# Patient Record
Sex: Female | Born: 1960 | Race: White | Hispanic: No | Smoking: Former smoker
Health system: Southern US, Community
[De-identification: ages and names within clinical notes are randomized; demographics above are authoritative.]

## PROBLEM LIST (undated history)

## (undated) DIAGNOSIS — E039 Hypothyroidism, unspecified: Secondary | ICD-10-CM

## (undated) DIAGNOSIS — E785 Hyperlipidemia, unspecified: Secondary | ICD-10-CM

## (undated) DIAGNOSIS — G4733 Obstructive sleep apnea (adult) (pediatric): Secondary | ICD-10-CM

## (undated) DIAGNOSIS — C159 Malignant neoplasm of esophagus, unspecified: Secondary | ICD-10-CM

## (undated) DIAGNOSIS — M1990 Unspecified osteoarthritis, unspecified site: Secondary | ICD-10-CM

## (undated) DIAGNOSIS — G8929 Other chronic pain: Secondary | ICD-10-CM

## (undated) DIAGNOSIS — E119 Type 2 diabetes mellitus without complications: Secondary | ICD-10-CM

## (undated) DIAGNOSIS — Z9289 Personal history of other medical treatment: Secondary | ICD-10-CM

## (undated) DIAGNOSIS — F32A Depression, unspecified: Secondary | ICD-10-CM

## (undated) DIAGNOSIS — K432 Incisional hernia without obstruction or gangrene: Secondary | ICD-10-CM

## (undated) DIAGNOSIS — H9190 Unspecified hearing loss, unspecified ear: Secondary | ICD-10-CM

## (undated) DIAGNOSIS — F329 Major depressive disorder, single episode, unspecified: Secondary | ICD-10-CM

## (undated) DIAGNOSIS — G4730 Sleep apnea, unspecified: Secondary | ICD-10-CM

## (undated) DIAGNOSIS — Z862 Personal history of diseases of the blood and blood-forming organs and certain disorders involving the immune mechanism: Secondary | ICD-10-CM

## (undated) DIAGNOSIS — I10 Essential (primary) hypertension: Secondary | ICD-10-CM

## (undated) DIAGNOSIS — K760 Fatty (change of) liver, not elsewhere classified: Secondary | ICD-10-CM

## (undated) DIAGNOSIS — F4310 Post-traumatic stress disorder, unspecified: Secondary | ICD-10-CM

## (undated) DIAGNOSIS — F419 Anxiety disorder, unspecified: Secondary | ICD-10-CM

## (undated) DIAGNOSIS — D099 Carcinoma in situ, unspecified: Secondary | ICD-10-CM

## (undated) HISTORY — PX: SHOULDER ARTHROSCOPY: SHX128

## (undated) HISTORY — PX: ABDOMINAL HYSTERECTOMY: SHX81

## (undated) HISTORY — PX: KNEE ARTHROSCOPY: SUR90

## (undated) HISTORY — PX: CERVICAL SPINE SURGERY: SHX589

## (undated) HISTORY — PX: MOUTH SURGERY: SHX715

---
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
## 2018-08-04 ENCOUNTER — Other Ambulatory Visit: Payer: Self-pay | Admitting: Neurosurgery

## 2018-08-04 DIAGNOSIS — M5416 Radiculopathy, lumbar region: Secondary | ICD-10-CM

## 2018-08-12 ENCOUNTER — Other Ambulatory Visit: Payer: Self-pay | Admitting: Orthopaedic Surgery

## 2018-08-12 DIAGNOSIS — M25551 Pain in right hip: Secondary | ICD-10-CM

## 2018-08-18 ENCOUNTER — Other Ambulatory Visit: Payer: Self-pay

## 2018-08-22 ENCOUNTER — Ambulatory Visit
Admission: RE | Admit: 2018-08-22 | Discharge: 2018-08-22 | Disposition: A | Discharge status: Home / Self Care | Payer: 59 | Source: Ambulatory Visit | Attending: Orthopaedic Surgery | Admitting: Orthopaedic Surgery

## 2018-08-22 ENCOUNTER — Ambulatory Visit: Payer: Self-pay

## 2018-08-22 DIAGNOSIS — M25551 Pain in right hip: Secondary | ICD-10-CM

## 2018-09-14 ENCOUNTER — Ambulatory Visit
Admission: RE | Admit: 2018-09-14 | Discharge: 2018-09-14 | Disposition: A | Discharge status: Home / Self Care | Payer: 59 | Source: Ambulatory Visit | Attending: Neurosurgery | Admitting: Neurosurgery

## 2018-09-14 DIAGNOSIS — M5416 Radiculopathy, lumbar region: Secondary | ICD-10-CM

## 2019-01-30 ENCOUNTER — Other Ambulatory Visit: Payer: Self-pay | Admitting: Orthopaedic Surgery

## 2019-02-08 ENCOUNTER — Encounter (HOSPITAL_COMMUNITY): Payer: Self-pay

## 2019-02-08 NOTE — Patient Instructions (Addendum)
DUE TO COVID-19 ONLY ONE VISITOR IS ALLOWED IN THE WAITING ROOM ONLY AT THIS TIME   COVID SWAB TESTING MUST BE COMPLETED ON: Friday, February 10, 2019 at    [address], [place] -[facility] enter pre surgical testing line (Must self quarantine after testing. Follow instructions on handout.)             Your procedure is scheduled on: Tuesday, Aug. 4, 2020    Report to [facility] Main  Entrance    Report to admitting at 12:15 PM   Call this number if you have problems the morning of surgery [phone]   Bring CPAP mask and tubing day of surgery   Do not eat food:After Midnight.   May have liquids until 11:45AM day of surgery   CLEAR LIQUID DIET  Foods Allowed                                                                     Foods Excluded  Water, Black Coffee and tea, regular and decaf                             liquids that you cannot  Plain Jell-O in any flavor  (No red)                                           see through such as: Fruit ices (not with fruit pulp)                                     milk, soups, orange juice  Iced Popsicles (No red)                                    All solid food Carbonated beverages, regular and diet                                    Apple juices Sports drinks like Gatorade (No red) Lightly seasoned clear broth or consume(fat free) Sugar, honey syrup  Sample Menu Breakfast                                Lunch                                     Supper Cranberry juice                    Beef broth                            Chicken broth Jell-O  Grape juice                           Apple juice Coffee or tea                        Jell-O                                      Popsicle                                                Coffee or tea                        Coffee or tea   Complete one G2 drink the morning of surgery at 11:45AM  the day of  surgery.   Brush your teeth the morning of surgery.   Do NOT smoke after Midnight   Take these medicines the morning of surgery with A SIP OF WATER: Duloxetine, Gabapentin, Levothyroxine, Rosuvastatin    Take diabetic medications per normal routine the day before surgery  DO NOT TAKE ANY DIABETIC MEDICATIONS DAY OF YOUR SURGERY                               You may not have any metal on your body including hair pins, jewelry, and body piercings             Do not wear make-up, lotions, powders, perfumes/cologne, or deodorant             Do not wear nail polish.  Do not shave  48 hours prior to surgery.               Do not bring valuables to the hospital. [place].   Contacts, dentures or bridgework may not be worn into surgery.   Bring small overnight bag day of surgery.    Special Instructions: Bring a copy of your healthcare power of attorney and living will documents         the day of surgery if you haven't scanned them in before.              Please read over the following fact sheets you were given:  [facility] - Preparing for Surgery Before surgery, you can play an important role.  Because skin is not sterile, your skin needs to be as free of germs as possible.  You can reduce the number of germs on your skin by washing with CHG (chlorahexidine gluconate) soap before surgery.  CHG is an antiseptic cleaner which kills germs and bonds with the skin to continue killing germs even after washing. Please DO NOT use if you have an allergy to CHG or antibacterial soaps.  If your skin becomes reddened/irritated stop using the CHG and inform your nurse when you arrive at Short Stay. Do not shave (including legs and underarms) for at least 48 hours prior to the first CHG shower.  You may shave your face/neck.  Please follow these instructions  carefully:  1.  Shower with CHG Soap the night before surgery and the  morning of surgery.  2.   If you choose to wash your hair, wash your hair first as usual with your normal  shampoo.  3.  After you shampoo, rinse your hair and body thoroughly to remove the shampoo.                             4.  Use CHG as you would any other liquid soap.  You can apply chg directly to the skin and wash.  Gently with a scrungie or clean washcloth.  5.  Apply the CHG Soap to your body ONLY FROM THE NECK DOWN.   Do   not use on face/ open                           Wound or open sores. Avoid contact with eyes, ears mouth and   genitals (private parts).                       Wash face,  Genitals (private parts) with your normal soap.             6.  Wash thoroughly, paying special attention to the area where your    surgery  will be performed.  7.  Thoroughly rinse your body with warm water from the neck down.  8.  DO NOT shower/wash with your normal soap after using and rinsing off the CHG Soap.                9.  Pat yourself dry with a clean towel.            10.  Wear clean pajamas.            11.  Place clean sheets on your bed the night of your first shower and do not  sleep with pets. Day of Surgery : Do not apply any lotions/deodorants the morning of surgery.  Please wear clean clothes to the hospital/surgery center.  FAILURE TO FOLLOW THESE INSTRUCTIONS MAY RESULT IN THE CANCELLATION OF YOUR SURGERY  PATIENT SIGNATURE_________________________________  NURSE SIGNATURE__________________________________  ________________________________________________________________________   Emma Foster  An incentive spirometer is a tool that can help keep your lungs clear and active. This tool measures how well you are filling your lungs with each breath. Taking long deep breaths may help reverse or decrease the chance of developing breathing (pulmonary) problems (especially infection) following:  A long period of time when you are unable to move or be active. BEFORE THE PROCEDURE   If the  spirometer includes an indicator to show your best effort, your nurse or respiratory therapist will set it to a desired goal.  If possible, sit up straight or lean slightly forward. Try not to slouch.  Hold the incentive spirometer in an upright position. INSTRUCTIONS FOR USE  1. Sit on the edge of your bed if possible, or sit up as far as you can in bed or on a chair. 2. Hold the incentive spirometer in an upright position. 3. Breathe out normally. 4. Place the mouthpiece in your mouth and seal your lips tightly around it. 5. Breathe in slowly and as deeply as possible, raising the piston or the ball toward the top of the column. 6. Hold your breath for 3-5 seconds or for as long as  possible. Allow the piston or ball to fall to the bottom of the column. 7. Remove the mouthpiece from your mouth and breathe out normally. 8. Rest for a few seconds and repeat Steps 1 through 7 at least 10 times every 1-2 hours when you are awake. Take your time and take a few normal breaths between deep breaths. 9. The spirometer may include an indicator to show your best effort. Use the indicator as a goal to work toward during each repetition. 10. After each set of 10 deep breaths, practice coughing to be sure your lungs are clear. If you have an incision (the cut made at the time of surgery), support your incision when coughing by placing a pillow or rolled up towels firmly against it. Once you are able to get out of bed, walk around indoors and cough well. You may stop using the incentive spirometer when instructed by your caregiver.  RISKS AND COMPLICATIONS  Take your time so you do not get dizzy or light-headed.  If you are in pain, you may need to take or ask for pain medication before doing incentive spirometry. It is harder to take a deep breath if you are having pain. AFTER USE  Rest and breathe slowly and easily.  It can be helpful to keep track of a log of your progress. Your caregiver can provide  you with a simple table to help with this. If you are using the spirometer at home, follow these instructions: [place] IF:   You are having difficultly using the spirometer.  You have trouble using the spirometer as often as instructed.  Your pain medication is not giving enough relief while using the spirometer.  You develop fever of 100.5 F (38.1 C) or higher. SEEK IMMEDIATE MEDICAL CARE IF:   You cough up bloody sputum that had not been present before.  You develop fever of 102 F (38.9 C) or greater.  You develop worsening pain at or near the incision site. MAKE SURE YOU:   Understand these instructions.  Will watch your condition.  Will get help right away if you are not doing well or get worse. Document Released: 11/09/2006 Document Revised: 09/21/2011 Document Reviewed: 01/10/2007 ExitCare Patient Information 2014 ExitCare, [place].   ________________________________________________________________________  WHAT IS A BLOOD TRANSFUSION? Blood Transfusion Information  A transfusion is the replacement of blood or some of its parts. Blood is made up of multiple cells which provide different functions.  Red blood cells carry oxygen and are used for blood loss replacement.  White blood cells fight against infection.  Platelets control bleeding.  Plasma helps clot blood.  Other blood products are available for specialized needs, such as hemophilia or other clotting disorders. BEFORE THE TRANSFUSION  Who gives blood for transfusions?   Healthy volunteers who are fully evaluated to make sure their blood is safe. This is blood bank blood. Transfusion therapy is the safest it has ever been in the practice of medicine. Before blood is taken from a donor, a complete history is taken to make sure that person has no history of diseases nor engages in risky social behavior (examples are intravenous drug use or sexual activity with multiple partners). The donor's  travel history is screened to minimize risk of transmitting infections, such as malaria. The donated blood is tested for signs of infectious diseases, such as HIV and hepatitis. The blood is then tested to be sure it is compatible with you in order to minimize the chance of a  transfusion reaction. If you or a relative donates blood, this is often done in anticipation of surgery and is not appropriate for emergency situations. It takes many days to process the donated blood. RISKS AND COMPLICATIONS Although transfusion therapy is very safe and saves many lives, the main dangers of transfusion include:   Getting an infectious disease.  Developing a transfusion reaction. This is an allergic reaction to something in the blood you were given. Every precaution is taken to prevent this. The decision to have a blood transfusion has been considered carefully by your caregiver before blood is given. Blood is not given unless the benefits outweigh the risks. AFTER THE TRANSFUSION  Right after receiving a blood transfusion, you will usually feel much better and more energetic. This is especially true if your red blood cells have gotten low (anemic). The transfusion raises the level of the red blood cells which carry oxygen, and this usually causes an energy increase.  The nurse administering the transfusion will monitor you carefully for complications. HOME CARE INSTRUCTIONS  No special instructions are needed after a transfusion. You may find your energy is better. Speak with your caregiver about any limitations on activity for underlying diseases you may have. SEEK MEDICAL CARE IF:   Your condition is not improving after your transfusion.  You develop redness or irritation at the intravenous (IV) site. SEEK IMMEDIATE MEDICAL CARE IF:  Any of the following symptoms occur over the next 12 hours:  Shaking chills.  You have a temperature by mouth above 102 F (38.9 C), not controlled by  medicine.  Chest, back, or muscle pain.  People around you feel you are not acting correctly or are confused.  Shortness of breath or difficulty breathing.  Dizziness and fainting.  You get a rash or develop hives.  You have a decrease in urine output.  Your urine turns a dark color or changes to pink, red, or brown. Any of the following symptoms occur over the next 10 days:  You have a temperature by mouth above 102 F (38.9 C), not controlled by medicine.  Shortness of breath.  Weakness after normal activity.  The white part of the eye turns yellow (jaundice).  You have a decrease in the amount of urine or are urinating less often.  Your urine turns a dark color or changes to pink, red, or brown. Document Released: 06/26/2000 Document Revised: 09/21/2011 Document Reviewed: 02/13/2008 [facility] Patient Information 2014 [place], [place].  _______________________________________________________________________

## 2019-02-09 ENCOUNTER — Other Ambulatory Visit: Payer: Self-pay

## 2019-02-09 ENCOUNTER — Encounter (HOSPITAL_COMMUNITY): Payer: Self-pay

## 2019-02-09 ENCOUNTER — Ambulatory Visit (HOSPITAL_COMMUNITY)
Admission: RE | Admit: 2019-02-09 | Discharge: 2019-02-09 | Disposition: A | Discharge status: Home / Self Care | Payer: 59 | Source: Ambulatory Visit | Attending: Orthopaedic Surgery | Admitting: Orthopaedic Surgery

## 2019-02-09 ENCOUNTER — Encounter (HOSPITAL_COMMUNITY)
Admission: RE | Admit: 2019-02-09 | Discharge: 2019-02-09 | Disposition: A | Discharge status: Home / Self Care | Payer: 59 | Source: Ambulatory Visit | Attending: Orthopaedic Surgery | Admitting: Orthopaedic Surgery

## 2019-02-09 DIAGNOSIS — Z01818 Encounter for other preprocedural examination: Secondary | ICD-10-CM

## 2019-02-09 HISTORY — DX: Personal history of other medical treatment: Z92.89

## 2019-02-09 HISTORY — DX: Unspecified osteoarthritis, unspecified site: M19.90

## 2019-02-09 HISTORY — DX: Depression, unspecified: F32.A

## 2019-02-09 HISTORY — DX: Type 2 diabetes mellitus without complications: E11.9

## 2019-02-09 HISTORY — DX: Unspecified hearing loss, unspecified ear: H91.90

## 2019-02-09 HISTORY — DX: Post-traumatic stress disorder, unspecified: F43.10

## 2019-02-09 HISTORY — DX: Obstructive sleep apnea (adult) (pediatric): G47.33

## 2019-02-09 HISTORY — DX: Essential (primary) hypertension: I10

## 2019-02-09 HISTORY — DX: Hypothyroidism, unspecified: E03.9

## 2019-02-09 HISTORY — DX: Carcinoma in situ, unspecified: D09.9

## 2019-02-09 HISTORY — DX: Incisional hernia without obstruction or gangrene: K43.2

## 2019-02-09 HISTORY — DX: Hyperlipidemia, unspecified: E78.5

## 2019-02-09 HISTORY — DX: Fatty (change of) liver, not elsewhere classified: K76.0

## 2019-02-09 HISTORY — DX: Sleep apnea, unspecified: G47.30

## 2019-02-09 HISTORY — DX: Personal history of diseases of the blood and blood-forming organs and certain disorders involving the immune mechanism: Z86.2

## 2019-02-09 HISTORY — DX: Anxiety disorder, unspecified: F41.9

## 2019-02-09 HISTORY — DX: Malignant neoplasm of esophagus, unspecified: C15.9

## 2019-02-09 LAB — CBC WITH DIFFERENTIAL/PLATELET
Abs Immature Granulocytes: 0.01 10*3/uL (ref 0.00–0.07)
Basophils Absolute: 0 10*3/uL (ref 0.0–0.1)
Basophils Relative: 0 %
Eosinophils Absolute: 0 10*3/uL (ref 0.0–0.5)
Eosinophils Relative: 0 %
HCT: 42.5 % (ref 36.0–46.0)
Hemoglobin: 14.2 g/dL (ref 12.0–15.0)
Immature Granulocytes: 0 %
Lymphocytes Relative: 10 %
Lymphs Abs: 0.6 10*3/uL — ABNORMAL LOW (ref 0.7–4.0)
MCH: 30.5 pg (ref 26.0–34.0)
MCHC: 33.4 g/dL (ref 30.0–36.0)
MCV: 91.2 fL (ref 80.0–100.0)
Monocytes Absolute: 0.4 10*3/uL (ref 0.1–1.0)
Monocytes Relative: 6 %
Neutro Abs: 4.8 10*3/uL (ref 1.7–7.7)
Neutrophils Relative %: 84 %
Platelets: 190 10*3/uL (ref 150–400)
RBC: 4.66 MIL/uL (ref 3.87–5.11)
RDW: 13.4 % (ref 11.5–15.5)
WBC: 5.7 10*3/uL (ref 4.0–10.5)
nRBC: 0 % (ref 0.0–0.2)

## 2019-02-09 LAB — BASIC METABOLIC PANEL
Anion gap: 11 (ref 5–15)
BUN: 13 mg/dL (ref 6–20)
CO2: 29 mmol/L (ref 22–32)
Calcium: 9.7 mg/dL (ref 8.9–10.3)
Chloride: 100 mmol/L (ref 98–111)
Creatinine, Ser: 0.99 mg/dL (ref 0.44–1.00)
GFR calc Af Amer: >60 mL/min (ref >60)
GFR calc non Af Amer: >60 mL/min (ref >60)
Glucose, Bld: 116 mg/dL — ABNORMAL HIGH (ref 70–99)
Potassium: 5 mmol/L (ref 3.5–5.1)
Sodium: 140 mmol/L (ref 135–145)

## 2019-02-09 LAB — URINALYSIS, ROUTINE W REFLEX MICROSCOPIC
Bilirubin Urine: NEGATIVE
Glucose, UA: NEGATIVE mg/dL
Hgb urine dipstick: NEGATIVE
Ketones, ur: NEGATIVE mg/dL
Nitrite: NEGATIVE
Protein, ur: NEGATIVE mg/dL
Specific Gravity, Urine: 1.013 (ref 1.005–1.030)
pH: 6 (ref 5.0–8.0)

## 2019-02-09 LAB — HEMOGLOBIN A1C
Hgb A1c MFr Bld: 6 % — ABNORMAL HIGH (ref 4.8–5.6)
Mean Plasma Glucose: 125.5 mg/dL

## 2019-02-09 LAB — PROTIME-INR
INR: 0.9 (ref 0.8–1.2)
Prothrombin Time: 11.9 seconds (ref 11.4–15.2)

## 2019-02-09 LAB — SURGICAL PCR SCREEN
MRSA, PCR: NEGATIVE
Staphylococcus aureus: NEGATIVE

## 2019-02-09 LAB — APTT: aPTT: 30 seconds (ref 24–36)

## 2019-02-09 LAB — GLUCOSE, CAPILLARY: Glucose-Capillary: 122 mg/dL — ABNORMAL HIGH (ref 70–99)

## 2019-02-09 NOTE — Progress Notes (Signed)
SPOKE W/  Emma Foster     SCREENING SYMPTOMS OF COVID 19:   COUGH--NO  RUNNY NOSE--- NO  SORE THROAT---NO  NASAL CONGESTION----NO  SNEEZING----NO  SHORTNESS OF BREATH---NO  DIFFICULTY BREATHING---NO  TEMP >100.0 -----NO  UNEXPLAINED BODY ACHES------NO  CHILLS -------- NO  HEADACHES ---------NO  LOSS OF SMELL/ TASTE --------NO    HAVE YOU OR ANY FAMILY MEMBER TRAVELLED PAST 14 DAYS OUT OF THE   COUNTY---NO STATE----NO COUNTRY----NO  HAVE YOU OR ANY FAMILY MEMBER BEEN EXPOSED TO ANYONE WITH COVID 19? NO

## 2019-02-10 ENCOUNTER — Other Ambulatory Visit (HOSPITAL_COMMUNITY): Payer: 59

## 2019-02-10 ENCOUNTER — Other Ambulatory Visit (HOSPITAL_COMMUNITY)
Admission: RE | Admit: 2019-02-10 | Discharge: 2019-02-10 | Disposition: A | Discharge status: Home / Self Care | Payer: 59 | Source: Ambulatory Visit | Attending: Orthopaedic Surgery | Admitting: Orthopaedic Surgery

## 2019-02-10 DIAGNOSIS — Z01812 Encounter for preprocedural laboratory examination: Secondary | ICD-10-CM | POA: Diagnosis present

## 2019-02-10 DIAGNOSIS — Z20828 Contact with and (suspected) exposure to other viral communicable diseases: Secondary | ICD-10-CM | POA: Insufficient documentation

## 2019-02-10 LAB — ABO/RH: ABO/RH(D): O POS

## 2019-02-11 LAB — SARS CORONAVIRUS 2 (TAT 6-24 HRS): SARS Coronavirus 2: NEGATIVE

## 2019-02-13 MED ORDER — TRANEXAMIC ACID 1000 MG/10ML IV SOLN
2000.0000 mg | INTRAVENOUS | Status: DC
  Filled 2019-02-13: qty 20

## 2019-02-13 MED ORDER — BUPIVACAINE LIPOSOME 1.3 % IJ SUSP
10.0000 mL | Freq: Once | INTRAMUSCULAR | Status: DC
  Filled 2019-02-13: qty 10

## 2019-02-13 NOTE — H&P (Signed)
TOTAL HIP ADMISSION H&P  Patient is admitted for right total hip arthroplasty.  Subjective:  Chief Complaint: right hip pain  HPI: Emma Foster, 58 y.o. female, has a history of pain and functional disability in the right hip(s) due to arthritis and patient has failed non-surgical conservative treatments for greater than 12 weeks to include NSAID's and/or analgesics, corticosteriod injections, use of assistive devices, weight reduction as appropriate and activity modification.  Onset of symptoms was gradual starting 5 years ago with gradually worsening course since that time.The patient noted no past surgery on the right hip(s).  Patient currently rates pain in the right hip at 10 out of 10 with activity. Patient has night pain, worsening of pain with activity and weight bearing, trendelenberg gait, pain that interfers with activities of daily living and crepitus. Patient has evidence of subchondral cysts, subchondral sclerosis, periarticular osteophytes and joint space narrowing by imaging studies. This condition presents safety issues increasing the risk of falls. There is no current active infection.  There are no active problems to display for this patient.  Past Medical History:  Diagnosis Date  . Anxiety   . Arthritis    hips and back  . Depression   . Diabetes mellitus without complication ([place])   . Esophageal cancer ([place])   . Fatty liver   . Hearing loss    right ear   . History of blood transfusion    4 unit   . History of iron deficiency anemia   . Hyperlipidemia   . Hypertension   . Hypothyroidism   . Incisional hernia   . OSA on CPAP   . PTSD (post-traumatic stress disorder)   . Sleep apnea   . Squamous cell carcinoma in situ     Past Surgical History:  Procedure Laterality Date  . ABDOMINAL HYSTERECTOMY    . CERVICAL SPINE SURGERY    . CESAREAN SECTION     x2  . KNEE ARTHROSCOPY Bilateral   . MOUTH SURGERY    . SHOULDER ARTHROSCOPY Left     Current  Facility-Administered Medications  Medication Dose Route Frequency Provider Last Rate Last Dose  . [START ON 02/14/2019] bupivacaine liposome (EXPAREL) 1.3 % injection 133 mg  10 mL Other Once Melrose Nakayama, MD      . Derrill Memo ON 02/14/2019] tranexamic acid (CYKLOKAPRON) 2,000 mg in sodium chloride 0.9 % 50 mL Topical Application  5,638 mg Topical To OR Melrose Nakayama, MD       Current Outpatient Medications  Medication Sig Dispense Refill Last Dose  . DULoxetine (CYMBALTA) 30 MG capsule Take 30 mg by mouth daily.     Marland Kitchen gabapentin (NEURONTIN) 800 MG tablet Take 800 mg by mouth 3 (three) times daily.     Marland Kitchen HYDROcodone-Ibuprofen 10-200 MG TABS Take 1 tablet by mouth 4 (four) times daily as needed (pain).     Marland Kitchen levothyroxine (SYNTHROID) 125 MCG tablet Take 125 mcg by mouth daily before breakfast.     . linaGLIPtin-metFORMIN HCl (JENTADUETO) 2.11-998 MG TABS Take 1 tablet by mouth 2 (two) times a day.     . losartan (COZAAR) 50 MG tablet Take 50 mg by mouth daily.     . QUEtiapine (SEROQUEL) 300 MG tablet Take 150 mg by mouth at bedtime.     . rosuvastatin (CRESTOR) 10 MG tablet Take 10 mg by mouth daily.     . SF 5000 PLUS 1.1 % CREA dental cream Place 1 application onto teeth 2 (two) times a day.     Marland Kitchen  tiZANidine (ZANAFLEX) 4 MG tablet Take 4 mg by mouth 3 (three) times daily as needed for muscle spasms.     Marland Kitchen zolpidem (AMBIEN) 10 MG tablet Take 10 mg by mouth at bedtime.      Allergies  Allergen Reactions  . Latex   . Iodine Rash  . Other     Bleach - rash  All steroids - altered mental state    Social History   Tobacco Use  . Smoking status: Former Research scientist (life sciences)  . Smokeless tobacco: Never Used  Substance Use Topics  . Alcohol use: Yes    No family history on file.   Review of Systems  Musculoskeletal: Positive for joint pain.       Right hip  All other systems reviewed and are negative.   Objective:  Physical Exam  Constitutional: She is oriented to person, place, and time. She  appears well-developed and well-nourished.  HENT:  Head: Normocephalic and atraumatic.  Eyes: Pupils are equal, round, and reactive to light.  Neck: Normal range of motion.  Cardiovascular: Normal rate and regular rhythm.  Respiratory: Effort normal.  GI: Soft.  Musculoskeletal:     Comments: Right hip motion is good though extremely painful in internal rotation.  She walks with an altered gait.  Straight leg raise causes some back pain.  Sensation and motor function are intact in her feet with palpable pulses on both sides.   Neurological: She is alert and oriented to person, place, and time.  Skin: Skin is warm and dry.  Psychiatric: She has a normal mood and affect. Her behavior is normal. Judgment and thought content normal.    Vital signs in last 24 hours:    Labs:   Estimated body mass index is 27.44 kg/m as calculated from the following:   Height as of 02/09/19: 5\' 6"  (1.676 m).   Weight as of 02/09/19: 77.1 kg.   Imaging Review Plain radiographs demonstrate severe degenerative joint disease of the right hip(s). The bone quality appears to be good for age and reported activity level.      Assessment/Plan:  End stage primary arthritis, right hip(s)  The patient history, physical examination, clinical judgement of the provider and imaging studies are consistent with end stage degenerative joint disease of the right hip(s) and total hip arthroplasty is deemed medically necessary. The treatment options including medical management, injection therapy, arthroscopy and arthroplasty were discussed at length. The risks and benefits of total hip arthroplasty were presented and reviewed. The risks due to aseptic loosening, infection, stiffness, dislocation/subluxation,  thromboembolic complications and other imponderables were discussed.  The patient acknowledged the explanation, agreed to proceed with the plan and consent was signed. Patient is being admitted for inpatient treatment  for surgery, pain control, PT, OT, prophylactic antibiotics, VTE prophylaxis, progressive ambulation and ADL's and discharge planning.The patient is planning to be discharged home with home health services.

## 2019-02-14 ENCOUNTER — Observation Stay (HOSPITAL_COMMUNITY)
Admission: RE | Admit: 2019-02-14 | Discharge: 2019-02-15 | Disposition: A | Discharge status: Home / Self Care | Payer: 59 | Attending: Orthopaedic Surgery | Admitting: Orthopaedic Surgery

## 2019-02-14 ENCOUNTER — Encounter (HOSPITAL_COMMUNITY)
Admission: RE | Disposition: A | Discharge status: Home / Self Care | Payer: Self-pay | Source: Home / Self Care | Attending: Orthopaedic Surgery

## 2019-02-14 ENCOUNTER — Inpatient Hospital Stay (HOSPITAL_COMMUNITY): Payer: 59 | Admitting: Physician Assistant

## 2019-02-14 ENCOUNTER — Inpatient Hospital Stay (HOSPITAL_COMMUNITY): Payer: 59

## 2019-02-14 ENCOUNTER — Other Ambulatory Visit: Payer: Self-pay

## 2019-02-14 ENCOUNTER — Inpatient Hospital Stay (HOSPITAL_COMMUNITY): Payer: 59 | Admitting: Anesthesiology

## 2019-02-14 ENCOUNTER — Encounter (HOSPITAL_COMMUNITY): Payer: Self-pay | Admitting: *Deleted

## 2019-02-14 DIAGNOSIS — H9191 Unspecified hearing loss, right ear: Secondary | ICD-10-CM | POA: Insufficient documentation

## 2019-02-14 DIAGNOSIS — E119 Type 2 diabetes mellitus without complications: Secondary | ICD-10-CM | POA: Diagnosis not present

## 2019-02-14 DIAGNOSIS — F329 Major depressive disorder, single episode, unspecified: Secondary | ICD-10-CM | POA: Diagnosis not present

## 2019-02-14 DIAGNOSIS — M1611 Unilateral primary osteoarthritis, right hip: Principal | ICD-10-CM | POA: Insufficient documentation

## 2019-02-14 DIAGNOSIS — E785 Hyperlipidemia, unspecified: Secondary | ICD-10-CM | POA: Diagnosis not present

## 2019-02-14 DIAGNOSIS — F4310 Post-traumatic stress disorder, unspecified: Secondary | ICD-10-CM | POA: Insufficient documentation

## 2019-02-14 DIAGNOSIS — F419 Anxiety disorder, unspecified: Secondary | ICD-10-CM | POA: Diagnosis not present

## 2019-02-14 DIAGNOSIS — Z91041 Radiographic dye allergy status: Secondary | ICD-10-CM | POA: Insufficient documentation

## 2019-02-14 DIAGNOSIS — Z91040 Latex allergy status: Secondary | ICD-10-CM | POA: Insufficient documentation

## 2019-02-14 DIAGNOSIS — Z8501 Personal history of malignant neoplasm of esophagus: Secondary | ICD-10-CM | POA: Diagnosis not present

## 2019-02-14 DIAGNOSIS — Z87891 Personal history of nicotine dependence: Secondary | ICD-10-CM | POA: Insufficient documentation

## 2019-02-14 DIAGNOSIS — I10 Essential (primary) hypertension: Secondary | ICD-10-CM | POA: Insufficient documentation

## 2019-02-14 DIAGNOSIS — E039 Hypothyroidism, unspecified: Secondary | ICD-10-CM | POA: Insufficient documentation

## 2019-02-14 DIAGNOSIS — G4733 Obstructive sleep apnea (adult) (pediatric): Secondary | ICD-10-CM | POA: Insufficient documentation

## 2019-02-14 DIAGNOSIS — Z9109 Other allergy status, other than to drugs and biological substances: Secondary | ICD-10-CM | POA: Insufficient documentation

## 2019-02-14 DIAGNOSIS — Z7982 Long term (current) use of aspirin: Secondary | ICD-10-CM | POA: Insufficient documentation

## 2019-02-14 DIAGNOSIS — Z9221 Personal history of antineoplastic chemotherapy: Secondary | ICD-10-CM | POA: Diagnosis not present

## 2019-02-14 DIAGNOSIS — Z79899 Other long term (current) drug therapy: Secondary | ICD-10-CM | POA: Diagnosis not present

## 2019-02-14 DIAGNOSIS — Z7984 Long term (current) use of oral hypoglycemic drugs: Secondary | ICD-10-CM | POA: Insufficient documentation

## 2019-02-14 DIAGNOSIS — Z90710 Acquired absence of both cervix and uterus: Secondary | ICD-10-CM | POA: Insufficient documentation

## 2019-02-14 DIAGNOSIS — K760 Fatty (change of) liver, not elsewhere classified: Secondary | ICD-10-CM | POA: Insufficient documentation

## 2019-02-14 DIAGNOSIS — Z791 Long term (current) use of non-steroidal anti-inflammatories (NSAID): Secondary | ICD-10-CM | POA: Diagnosis not present

## 2019-02-14 DIAGNOSIS — Z419 Encounter for procedure for purposes other than remedying health state, unspecified: Secondary | ICD-10-CM

## 2019-02-14 HISTORY — PX: TOTAL HIP ARTHROPLASTY: SHX124

## 2019-02-14 LAB — TYPE AND SCREEN
ABO/RH(D): O POS
Antibody Screen: NEGATIVE

## 2019-02-14 LAB — GLUCOSE, CAPILLARY
Glucose-Capillary: 220 mg/dL — ABNORMAL HIGH (ref 70–99)
Glucose-Capillary: 90 mg/dL (ref 70–99)

## 2019-02-14 SURGERY — ARTHROPLASTY, HIP, TOTAL, ANTERIOR APPROACH
Anesthesia: Spinal | Site: Hip | Laterality: Right

## 2019-02-14 MED ORDER — KETOROLAC TROMETHAMINE 15 MG/ML IJ SOLN
15.0000 mg | Freq: Four times a day (QID) | INTRAMUSCULAR | Status: AC
  Administered 2019-02-14 – 2019-02-15 (×4): 15 mg via INTRAVENOUS
  Filled 2019-02-14 (×4): qty 1

## 2019-02-14 MED ORDER — FENTANYL CITRATE (PF) 100 MCG/2ML IJ SOLN
INTRAMUSCULAR | Status: DC | PRN
  Administered 2019-02-14 (×2): 50 ug via INTRAVENOUS

## 2019-02-14 MED ORDER — HYDROCODONE-ACETAMINOPHEN 5-325 MG PO TABS: 1.0000 | ORAL_TABLET | ORAL | Status: DC | PRN

## 2019-02-14 MED ORDER — LINAGLIPTIN-METFORMIN HCL 2.5-1000 MG PO TABS: 1.0000 | ORAL_TABLET | Freq: Two times a day (BID) | ORAL | Status: DC

## 2019-02-14 MED ORDER — PHENYLEPHRINE 40 MCG/ML (10ML) SYRINGE FOR IV PUSH (FOR BLOOD PRESSURE SUPPORT)
PREFILLED_SYRINGE | INTRAVENOUS | Status: AC
  Filled 2019-02-14: qty 10

## 2019-02-14 MED ORDER — DIPHENHYDRAMINE HCL 12.5 MG/5ML PO ELIX: 12.5000 mg | ORAL_SOLUTION | ORAL | Status: DC | PRN

## 2019-02-14 MED ORDER — MIDAZOLAM HCL 5 MG/5ML IJ SOLN
INTRAMUSCULAR | Status: DC | PRN
  Administered 2019-02-14: 2 mg via INTRAVENOUS

## 2019-02-14 MED ORDER — PHENOL 1.4 % MT LIQD: 1.0000 | OROMUCOSAL | Status: DC | PRN

## 2019-02-14 MED ORDER — MORPHINE SULFATE (PF) 2 MG/ML IV SOLN
0.5000 mg | INTRAVENOUS | Status: DC | PRN
  Administered 2019-02-14: 1 mg via INTRAVENOUS
  Administered 2019-02-14 (×2): 0.5 mg via INTRAVENOUS
  Administered 2019-02-15 (×2): 1 mg via INTRAVENOUS
  Filled 2019-02-14 (×5): qty 1

## 2019-02-14 MED ORDER — ACETAMINOPHEN 500 MG PO TABS
500.0000 mg | ORAL_TABLET | Freq: Four times a day (QID) | ORAL | Status: DC
  Administered 2019-02-15 (×2): 500 mg via ORAL
  Filled 2019-02-14 (×3): qty 1

## 2019-02-14 MED ORDER — ONDANSETRON HCL 4 MG/2ML IJ SOLN
4.0000 mg | Freq: Four times a day (QID) | INTRAMUSCULAR | Status: DC | PRN
  Administered 2019-02-14: 20:00:00 4 mg via INTRAVENOUS
  Filled 2019-02-14: qty 2

## 2019-02-14 MED ORDER — ONDANSETRON HCL 4 MG PO TABS: 4.0000 mg | ORAL_TABLET | Freq: Four times a day (QID) | ORAL | Status: DC | PRN

## 2019-02-14 MED ORDER — GABAPENTIN 400 MG PO CAPS
800.0000 mg | ORAL_CAPSULE | Freq: Three times a day (TID) | ORAL | Status: DC
  Administered 2019-02-14 – 2019-02-15 (×2): 800 mg via ORAL
  Filled 2019-02-14 (×2): qty 2

## 2019-02-14 MED ORDER — OXYCODONE HCL 5 MG PO TABS: 5.0000 mg | ORAL_TABLET | Freq: Once | ORAL | Status: DC | PRN

## 2019-02-14 MED ORDER — PHENYLEPHRINE HCL (PRESSORS) 10 MG/ML IV SOLN
INTRAVENOUS | Status: AC
  Filled 2019-02-14: qty 1

## 2019-02-14 MED ORDER — BUPIVACAINE LIPOSOME 1.3 % IJ SUSP
10.0000 mL | Freq: Once | INTRAMUSCULAR | Status: AC
  Administered 2019-02-14: 10 mL
  Filled 2019-02-14: qty 10

## 2019-02-14 MED ORDER — TRANEXAMIC ACID-NACL 1000-0.7 MG/100ML-% IV SOLN
1000.0000 mg | INTRAVENOUS | Status: AC
  Administered 2019-02-14: 15:00:00 1000 mg via INTRAVENOUS
  Filled 2019-02-14: qty 100

## 2019-02-14 MED ORDER — CEFAZOLIN SODIUM-DEXTROSE 2-4 GM/100ML-% IV SOLN
2.0000 g | INTRAVENOUS | Status: AC
  Administered 2019-02-14: 2 g via INTRAVENOUS
  Filled 2019-02-14: qty 100

## 2019-02-14 MED ORDER — LACTATED RINGERS IV SOLN
INTRAVENOUS | Status: DC
  Administered 2019-02-14 (×2): via INTRAVENOUS

## 2019-02-14 MED ORDER — ALUM & MAG HYDROXIDE-SIMETH 200-200-20 MG/5ML PO SUSP: 30.0000 mL | ORAL | Status: DC | PRN

## 2019-02-14 MED ORDER — TRANEXAMIC ACID-NACL 1000-0.7 MG/100ML-% IV SOLN
1000.0000 mg | Freq: Once | INTRAVENOUS | Status: AC
  Administered 2019-02-14: 1000 mg via INTRAVENOUS
  Filled 2019-02-14: qty 100

## 2019-02-14 MED ORDER — CEFAZOLIN SODIUM-DEXTROSE 2-4 GM/100ML-% IV SOLN
2.0000 g | Freq: Four times a day (QID) | INTRAVENOUS | Status: AC
  Administered 2019-02-14 – 2019-02-15 (×2): 2 g via INTRAVENOUS
  Filled 2019-02-14 (×2): qty 100

## 2019-02-14 MED ORDER — FENTANYL CITRATE (PF) 100 MCG/2ML IJ SOLN
INTRAMUSCULAR | Status: AC
  Filled 2019-02-14: qty 2

## 2019-02-14 MED ORDER — PROPOFOL 10 MG/ML IV BOLUS
INTRAVENOUS | Status: AC
  Filled 2019-02-14: qty 60

## 2019-02-14 MED ORDER — STERILE WATER FOR IRRIGATION IR SOLN
Status: DC | PRN
  Administered 2019-02-14: 2000 mL

## 2019-02-14 MED ORDER — ZOLPIDEM TARTRATE 5 MG PO TABS
5.0000 mg | ORAL_TABLET | Freq: Every day | ORAL | Status: DC
  Administered 2019-02-14: 5 mg via ORAL
  Filled 2019-02-14 (×2): qty 1

## 2019-02-14 MED ORDER — LACTATED RINGERS IV SOLN
INTRAVENOUS | Status: DC
  Administered 2019-02-14 (×2): via INTRAVENOUS

## 2019-02-14 MED ORDER — METHOCARBAMOL 500 MG IVPB - SIMPLE MED
500.0000 mg | Freq: Four times a day (QID) | INTRAVENOUS | Status: DC | PRN
  Filled 2019-02-14: qty 50

## 2019-02-14 MED ORDER — ACETAMINOPHEN 325 MG PO TABS: 325.0000 mg | ORAL_TABLET | Freq: Four times a day (QID) | ORAL | Status: DC | PRN

## 2019-02-14 MED ORDER — HYDROCODONE-ACETAMINOPHEN 7.5-325 MG PO TABS
1.0000 | ORAL_TABLET | ORAL | Status: DC | PRN
  Administered 2019-02-14 – 2019-02-15 (×5): 2 via ORAL
  Filled 2019-02-14 (×5): qty 2

## 2019-02-14 MED ORDER — LEVOTHYROXINE SODIUM 125 MCG PO TABS
125.0000 ug | ORAL_TABLET | Freq: Every day | ORAL | Status: DC
  Administered 2019-02-15: 125 ug via ORAL
  Filled 2019-02-14: qty 1

## 2019-02-14 MED ORDER — TRANEXAMIC ACID 1000 MG/10ML IV SOLN
INTRAVENOUS | Status: DC | PRN
  Administered 2019-02-14: 2000 mg via TOPICAL

## 2019-02-14 MED ORDER — FENTANYL CITRATE (PF) 100 MCG/2ML IJ SOLN: 25.0000 ug | INTRAMUSCULAR | Status: DC | PRN

## 2019-02-14 MED ORDER — LOSARTAN POTASSIUM 50 MG PO TABS
50.0000 mg | ORAL_TABLET | Freq: Every day | ORAL | Status: DC
  Administered 2019-02-15: 50 mg via ORAL
  Filled 2019-02-14: qty 1

## 2019-02-14 MED ORDER — ONDANSETRON HCL 4 MG/2ML IJ SOLN: 4.0000 mg | Freq: Once | INTRAMUSCULAR | Status: DC | PRN

## 2019-02-14 MED ORDER — DULOXETINE HCL 30 MG PO CPEP
30.0000 mg | ORAL_CAPSULE | Freq: Every day | ORAL | Status: DC
  Administered 2019-02-15: 30 mg via ORAL
  Filled 2019-02-14: qty 1

## 2019-02-14 MED ORDER — METFORMIN HCL 500 MG PO TABS
1000.0000 mg | ORAL_TABLET | Freq: Two times a day (BID) | ORAL | Status: DC
  Administered 2019-02-15: 07:00:00 1000 mg via ORAL
  Filled 2019-02-14: qty 2

## 2019-02-14 MED ORDER — ASPIRIN 81 MG PO CHEW
81.0000 mg | CHEWABLE_TABLET | Freq: Two times a day (BID) | ORAL | Status: DC
  Administered 2019-02-14 – 2019-02-15 (×2): 81 mg via ORAL
  Filled 2019-02-14 (×2): qty 1

## 2019-02-14 MED ORDER — METOCLOPRAMIDE HCL 5 MG PO TABS: 5.0000 mg | ORAL_TABLET | Freq: Three times a day (TID) | ORAL | Status: DC | PRN

## 2019-02-14 MED ORDER — CHLORHEXIDINE GLUCONATE 4 % EX LIQD
60.0000 mL | Freq: Once | CUTANEOUS | Status: AC
  Administered 2019-02-14: 4 via TOPICAL

## 2019-02-14 MED ORDER — METHOCARBAMOL 500 MG PO TABS
500.0000 mg | ORAL_TABLET | Freq: Four times a day (QID) | ORAL | Status: DC | PRN
  Administered 2019-02-14 – 2019-02-15 (×2): 500 mg via ORAL
  Filled 2019-02-14 (×2): qty 1

## 2019-02-14 MED ORDER — PROPOFOL 500 MG/50ML IV EMUL
INTRAVENOUS | Status: DC | PRN
  Administered 2019-02-14: 100 ug/kg/min via INTRAVENOUS

## 2019-02-14 MED ORDER — BUPIVACAINE-EPINEPHRINE (PF) 0.25% -1:200000 IJ SOLN
INTRAMUSCULAR | Status: DC | PRN
  Administered 2019-02-14: 30 mL

## 2019-02-14 MED ORDER — BUPIVACAINE-EPINEPHRINE (PF) 0.25% -1:200000 IJ SOLN
INTRAMUSCULAR | Status: AC
  Filled 2019-02-14: qty 30

## 2019-02-14 MED ORDER — PROPOFOL 10 MG/ML IV BOLUS
INTRAVENOUS | Status: DC | PRN
  Administered 2019-02-14: 40 mg via INTRAVENOUS

## 2019-02-14 MED ORDER — PHENYLEPHRINE 40 MCG/ML (10ML) SYRINGE FOR IV PUSH (FOR BLOOD PRESSURE SUPPORT)
PREFILLED_SYRINGE | INTRAVENOUS | Status: DC | PRN
  Administered 2019-02-14 (×3): 80 ug via INTRAVENOUS

## 2019-02-14 MED ORDER — BUPIVACAINE IN DEXTROSE 0.75-8.25 % IT SOLN
INTRATHECAL | Status: DC | PRN
  Administered 2019-02-14: 2 mL via INTRATHECAL

## 2019-02-14 MED ORDER — BISACODYL 5 MG PO TBEC: 5.0000 mg | DELAYED_RELEASE_TABLET | Freq: Every day | ORAL | Status: DC | PRN

## 2019-02-14 MED ORDER — MENTHOL 3 MG MT LOZG: 1.0000 | LOZENGE | OROMUCOSAL | Status: DC | PRN

## 2019-02-14 MED ORDER — 0.9 % SODIUM CHLORIDE (POUR BTL) OPTIME
TOPICAL | Status: DC | PRN
  Administered 2019-02-14: 15:00:00 1000 mL

## 2019-02-14 MED ORDER — SODIUM CHLORIDE 0.9 % IV SOLN
INTRAVENOUS | Status: DC | PRN
  Administered 2019-02-14: 75 ug/min via INTRAVENOUS

## 2019-02-14 MED ORDER — OXYCODONE HCL 5 MG/5ML PO SOLN: 5.0000 mg | Freq: Once | ORAL | Status: DC | PRN

## 2019-02-14 MED ORDER — ROSUVASTATIN CALCIUM 10 MG PO TABS
10.0000 mg | ORAL_TABLET | Freq: Every day | ORAL | Status: DC
  Administered 2019-02-15: 10 mg via ORAL
  Filled 2019-02-14: qty 1

## 2019-02-14 MED ORDER — LINAGLIPTIN 5 MG PO TABS
2.5000 mg | ORAL_TABLET | Freq: Two times a day (BID) | ORAL | Status: DC
  Administered 2019-02-15: 2.5 mg via ORAL
  Filled 2019-02-14: qty 1

## 2019-02-14 MED ORDER — DOCUSATE SODIUM 100 MG PO CAPS
100.0000 mg | ORAL_CAPSULE | Freq: Two times a day (BID) | ORAL | Status: DC
  Administered 2019-02-14 – 2019-02-15 (×2): 100 mg via ORAL
  Filled 2019-02-14 (×2): qty 1

## 2019-02-14 MED ORDER — METOCLOPRAMIDE HCL 5 MG/ML IJ SOLN: 5.0000 mg | Freq: Three times a day (TID) | INTRAMUSCULAR | Status: DC | PRN

## 2019-02-14 MED ORDER — MIDAZOLAM HCL 2 MG/2ML IJ SOLN
INTRAMUSCULAR | Status: AC
  Filled 2019-02-14: qty 2

## 2019-02-14 MED ORDER — QUETIAPINE FUMARATE 50 MG PO TABS
150.0000 mg | ORAL_TABLET | Freq: Every day | ORAL | Status: DC
  Administered 2019-02-14: 150 mg via ORAL
  Filled 2019-02-14 (×2): qty 3

## 2019-02-14 SURGICAL SUPPLY — 41 items
BAG DECANTER FOR FLEXI CONT (MISCELLANEOUS) ×3 IMPLANT
BLADE SAW SGTL 18X1.27X75 (BLADE) ×2 IMPLANT
BLADE SAW SGTL 18X1.27X75MM (BLADE) ×1
CELLS DAT CNTRL 66122 CELL SVR (MISCELLANEOUS) ×1 IMPLANT
COVER PERINEAL POST (MISCELLANEOUS) ×3 IMPLANT
COVER SURGICAL LIGHT HANDLE (MISCELLANEOUS) ×3 IMPLANT
COVER WAND RF STERILE (DRAPES) IMPLANT
CUP GRIPTON 48MM 100 HIP (Hips) ×3 IMPLANT
DECANTER SPIKE VIAL GLASS SM (MISCELLANEOUS) ×6 IMPLANT
DRAPE IMP U-DRAPE 54X76 (DRAPES) ×3 IMPLANT
DRAPE STERI IOBAN 125X83 (DRAPES) ×3 IMPLANT
DRAPE U-SHAPE 47X51 STRL (DRAPES) ×6 IMPLANT
DRSG AQUACEL AG ADV 3.5X10 (GAUZE/BANDAGES/DRESSINGS) ×3 IMPLANT
DURAPREP 26ML APPLICATOR (WOUND CARE) ×3 IMPLANT
ELECT BLADE TIP CTD 4 INCH (ELECTRODE) ×3 IMPLANT
ELECT REM PT RETURN 15FT ADLT (MISCELLANEOUS) ×3 IMPLANT
ELIMINATOR HOLE APEX DEPUY (Hips) ×3 IMPLANT
GLOVE BIOGEL PI IND STRL 8 (GLOVE) ×2 IMPLANT
GLOVE BIOGEL PI INDICATOR 8 (GLOVE) ×4
GOWN STRL REUS W/TWL XL LVL3 (GOWN DISPOSABLE) ×6 IMPLANT
HEAD FEMORAL 32 CERAMIC (Hips) ×3 IMPLANT
HOLDER FOLEY CATH W/STRAP (MISCELLANEOUS) ×3 IMPLANT
KIT TURNOVER KIT A (KITS) IMPLANT
LINER ACET 32X48 (Liner) ×3 IMPLANT
MANIFOLD NEPTUNE II (INSTRUMENTS) ×3 IMPLANT
NEEDLE HYPO 22GX1.5 SAFETY (NEEDLE) ×3 IMPLANT
NS IRRIG 1000ML POUR BTL (IV SOLUTION) ×3 IMPLANT
PACK ANTERIOR HIP CUSTOM (KITS) ×3 IMPLANT
PROTECTOR NERVE ULNAR (MISCELLANEOUS) ×3 IMPLANT
RTRCTR WOUND ALEXIS 18CM MED (MISCELLANEOUS) ×3
STEM FEM SZ3 STD ACTIS (Stem) ×3 IMPLANT
SUT ETHIBOND NAB CT1 #1 30IN (SUTURE) ×6 IMPLANT
SUT VIC AB 1 CT1 36 (SUTURE) ×3 IMPLANT
SUT VIC AB 2-0 CT1 27 (SUTURE) ×2
SUT VIC AB 2-0 CT1 TAPERPNT 27 (SUTURE) ×1 IMPLANT
SUT VIC AB 3-0 PS2 18 (SUTURE) ×2
SUT VIC AB 3-0 PS2 18XBRD (SUTURE) ×1 IMPLANT
SUT VLOC 180 0 24IN GS25 (SUTURE) ×3 IMPLANT
SYR 50ML LL SCALE MARK (SYRINGE) ×3 IMPLANT
TRAY FOLEY MTR SLVR 16FR STAT (SET/KITS/TRAYS/PACK) ×3 IMPLANT
YANKAUER SUCT BULB TIP 10FT TU (MISCELLANEOUS) ×3 IMPLANT

## 2019-02-14 NOTE — Progress Notes (Signed)
Dr Christella Hartigan states OK for patient to use biotin spray for dry mouth

## 2019-02-14 NOTE — Interval H&P Note (Signed)
History and Physical Interval Note:  02/14/2019 2:18 PM  Emma Foster  has presented today for surgery, with the diagnosis of Right Hip Degenerative Joint Disease.  The various methods of treatment have been discussed with the patient and family. After consideration of risks, benefits and other options for treatment, the patient has consented to  Procedure(s): Right Anterior Hip Arthroplasty (Right) as a surgical intervention.  The patient's history has been reviewed, patient examined, no change in status, stable for surgery.  I have reviewed the patient's chart and labs.  Questions were answered to the patient's satisfaction.     [name]

## 2019-02-14 NOTE — Anesthesia Procedure Notes (Signed)
Spinal  Patient location during procedure: OR Start time: 02/14/2019 2:31 PM Staffing Resident/CRNA: [place] Elika Godar C, CRNA Performed: resident/CRNA  Preanesthetic Checklist Completed: patient identified, site marked, surgical consent, pre-op evaluation, timeout performed, IV checked, risks and benefits discussed and monitors and equipment checked Spinal Block Patient position: sitting Prep: Betadine and DuraPrep Patient monitoring: heart rate, continuous pulse ox and blood pressure Approach: midline Location: L3-4 Injection technique: single-shot Needle Needle type: Pencan  Needle gauge: 24 G Needle length: 9 cm Assessment Sensory level: T4 Additional Notes Expiration date of kit checked and confirmed. Patient tolerated procedure well, without complications.

## 2019-02-14 NOTE — Anesthesia Preprocedure Evaluation (Addendum)
Anesthesia Evaluation    Reviewed: Allergy & Precautions, Patient's Chart, lab work & pertinent test results  History of Anesthesia Complications Negative for: history of anesthetic complications  Airway Mallampati: II  TM Distance: >3 FB Neck ROM: Limited    Dental  (+) Partial Lower   Pulmonary sleep apnea and Continuous Positive Airway Pressure Ventilation , former smoker,    Pulmonary exam normal        Cardiovascular hypertension, Pt. 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   Reproductive/Obstetrics                         Anesthesia Physical Anesthesia Plan  ASA: III  Anesthesia Plan: Spinal   Post-op Pain Management:    Induction:   PONV Risk Score and Plan: 2 and Propofol infusion, Treatment may vary due to age or medical condition and Midazolam  Airway Management Planned: Nasal Cannula and Simple Face Mask  Additional Equipment: None  Intra-op Plan:   Post-operative Plan:   Informed Consent: I have reviewed the patients History and Physical, chart, labs and discussed the procedure including the risks, benefits and alternatives for the proposed anesthesia with the patient or authorized representative who has indicated his/her understanding and acceptance.       Plan Discussed with:   Anesthesia Plan Comments:        Anesthesia Quick Evaluation

## 2019-02-14 NOTE — Transfer of Care (Signed)
Immediate Anesthesia Transfer of Care Note  Patient: Emma Foster  Procedure(s) Performed: Right Anterior Hip Arthroplasty (Right Hip)  Patient Location: PACU  Anesthesia Type:Spinal  Level of Consciousness: awake, alert  and oriented  Airway & Oxygen Therapy: Patient Spontanous Breathing and Patient connected to face mask oxygen  Post-op Assessment: Report given to RN and Post -op Vital signs reviewed and stable  Post vital signs: Reviewed and stable  Last Vitals:  Vitals Value Taken Time  BP 121/73 02/14/19 1617  Temp    Pulse 78 02/14/19 1619  Resp 20 02/14/19 1619  SpO2 100 % 02/14/19 1619  Vitals shown include unvalidated device data.  Last Pain:  Vitals:   02/14/19 1321  TempSrc:   PainSc: 0-No pain         Complications: No apparent anesthesia complications

## 2019-02-14 NOTE — Op Note (Signed)

## 2019-02-14 NOTE — Plan of Care (Signed)
Plan of care 

## 2019-02-15 ENCOUNTER — Encounter (HOSPITAL_COMMUNITY): Payer: Self-pay | Admitting: Orthopaedic Surgery

## 2019-02-15 ENCOUNTER — Other Ambulatory Visit: Payer: Self-pay | Admitting: Orthopaedic Surgery

## 2019-02-15 DIAGNOSIS — M1611 Unilateral primary osteoarthritis, right hip: Secondary | ICD-10-CM | POA: Diagnosis not present

## 2019-02-15 MED ORDER — HYDROCODONE-IBUPROFEN 10-200 MG PO TABS: 1.0000 | ORAL_TABLET | Freq: Four times a day (QID) | ORAL | 0 refills | Status: DC | PRN

## 2019-02-15 MED ORDER — TIZANIDINE HCL 4 MG PO TABS: 4.0000 mg | ORAL_TABLET | Freq: Four times a day (QID) | ORAL | 1 refills | Status: AC | PRN

## 2019-02-15 MED ORDER — ASPIRIN 81 MG PO CHEW: 81.0000 mg | CHEWABLE_TABLET | Freq: Two times a day (BID) | ORAL | 0 refills | Status: DC

## 2019-02-15 NOTE — Discharge Summary (Signed)
Patient ID: Emma Foster MRN: [id number] DOB/AGE: [birth year] 58 y.o.  Admit date: 02/14/2019 Discharge date: 02/15/2019  Admission Diagnoses:  Principal Problem:   Primary localized osteoarthritis of right hip Active Problems:   Primary osteoarthritis of right hip   Discharge Diagnoses:  Same  Past Medical History:  Diagnosis Date  . Anxiety   . Arthritis    hips and back  . Depression   . Diabetes mellitus without complication ([place])   . Esophageal cancer ([place])   . Fatty liver   . Hearing loss    right ear   . History of blood transfusion    4 unit   . History of iron deficiency anemia   . Hyperlipidemia   . Hypertension   . Hypothyroidism   . Incisional hernia   . OSA on CPAP   . PTSD (post-traumatic stress disorder)   . Sleep apnea   . Squamous cell carcinoma in situ     Surgeries: Procedure(s): Right Anterior Hip Arthroplasty on 02/14/2019   Consultants:   Discharged Condition: Improved  Hospital Course: Emma Foster is an 58 y.o. female who was admitted 02/14/2019 for operative treatment ofPrimary localized osteoarthritis of right hip. Patient has severe unremitting pain that affects sleep, daily activities, and work/hobbies. After pre-op clearance the patient was taken to the operating room on 02/14/2019 and underwent  Procedure(s): Right Anterior Hip Arthroplasty.    Patient was given perioperative antibiotics:  Anti-infectives (From admission, onward)   Start     Dose/Rate Route Frequency Ordered Stop   02/15/19 0600  ceFAZolin (ANCEF) IVPB 2g/100 mL premix     2 g 200 mL/hr over 30 Minutes Intravenous On call to O.R. 02/14/19 1229 02/14/19 1433   02/14/19 2030  ceFAZolin (ANCEF) IVPB 2g/100 mL premix     2 g 200 mL/hr over 30 Minutes Intravenous Every 6 hours 02/14/19 1821 02/15/19 0351       Patient was given sequential compression devices, early ambulation, and chemoprophylaxis to prevent DVT.  Patient benefited maximally from hospital stay and there  were no complications.    Recent vital signs:  Patient Vitals for the past 24 hrs:  BP Temp Temp src Pulse Resp SpO2 Height Weight  02/15/19 0624 100/66 97.6 F (36.4 C) Oral 84 16 94 % - -  02/15/19 0112 99/64 - - 90 - 99 % - -  02/15/19 0110 99/61 98.3 F (36.8 C) Oral 88 18 95 % - -  02/14/19 2210 125/80 98 F (36.7 C) Oral 84 16 98 % - -  02/14/19 2134 128/76 97.9 F (36.6 C) Oral 77 16 99 % - -  02/14/19 2003 118/78 98 F (36.7 C) Oral 86 18 100 % - -  02/14/19 1915 102/63 (!) 97.5 F (36.4 C) Oral 81 16 98 % - -  02/14/19 1815 101/69 (!) 97.4 F (36.3 C) Oral 71 16 100 % 5\' 6"  (1.676 m) 77 kg  02/14/19 1800 108/73 (!) 97.5 F (36.4 C) - 70 19 100 % - -  02/14/19 1745 106/69 - - 75 14 100 % - -  02/14/19 1730 101/71 - - 70 10 100 % - -  02/14/19 1715 96/65 - - 68 11 100 % - -  02/14/19 1700 95/72 - - 74 16 100 % - -  02/14/19 1645 101/63 - - 68 16 100 % - -  02/14/19 1630 97/70 - - 84 15 92 % - -  02/14/19 1617 121/73 97.7 F (36.5 C) - 70  11 100 % - -  02/14/19 1321 - - - - - - 5\' 6"  (1.676 m) 77 kg  02/14/19 1258 126/77 98.4 F (36.9 C) Oral (!) 106 18 98 % - -     Recent laboratory studies: No results for input(s): WBC, HGB, HCT, PLT, NA, K, CL, CO2, BUN, CREATININE, GLUCOSE, INR, CALCIUM in the last 72 hours.  Invalid input(s): PT, 2   Discharge Medications:   Allergies as of 02/15/2019      Reactions   Latex    Iodine Rash   Other    Bleach - rash All steroids - altered mental state      Medication List    TAKE these medications   aspirin 81 MG chewable tablet Chew 1 tablet (81 mg total) by mouth 2 (two) times daily.   DULoxetine 30 MG capsule Commonly known as: CYMBALTA Take 30 mg by mouth daily.   gabapentin 800 MG tablet Commonly known as: NEURONTIN Take 800 mg by mouth 3 (three) times daily.   HYDROcodone-Ibuprofen 10-200 MG Tabs Take 1 tablet by mouth 4 (four) times daily as needed (pain).   Jentadueto 2.11-998 MG Tabs Generic drug:  linaGLIPtin-metFORMIN HCl Take 1 tablet by mouth 2 (two) times a day.   levothyroxine 125 MCG tablet Commonly known as: SYNTHROID Take 125 mcg by mouth daily before breakfast.   losartan 50 MG tablet Commonly known as: COZAAR Take 50 mg by mouth daily.   QUEtiapine 300 MG tablet Commonly known as: SEROQUEL Take 150 mg by mouth at bedtime.   rosuvastatin 10 MG tablet Commonly known as: CRESTOR Take 10 mg by mouth daily.   SF 5000 Plus 1.1 % Crea dental cream Generic drug: sodium fluoride Place 1 application onto teeth 2 (two) times a day.   tiZANidine 4 MG tablet Commonly known as: ZANAFLEX Take 1 tablet (4 mg total) by mouth every 6 (six) hours as needed for muscle spasms. What changed: when to take this   zolpidem 10 MG tablet Commonly known as: AMBIEN Take 10 mg by mouth at bedtime.            Durable Medical Equipment  (From admission, onward)         Start     Ordered   02/14/19 1822  DME Walker rolling  Once    Question:  Patient needs a walker to treat with the following condition  Answer:  Primary osteoarthritis of right hip   02/14/19 1821   02/14/19 1822  DME 3 n 1  Once     02/14/19 1821   02/14/19 1822  DME Bedside commode  Once    Question:  Patient needs a bedside commode to treat with the following condition  Answer:  Primary osteoarthritis of right hip   02/14/19 1821          Diagnostic Studies: Dg Chest 2 View  Result Date: 02/10/2019 CLINICAL DATA:  Preop evaluation for upcoming hip replacement EXAM: CHEST - 2 VIEW COMPARISON:  None. FINDINGS: The heart size and mediastinal contours are within normal limits. Both lungs are clear. The visualized skeletal structures are unremarkable. IMPRESSION: No active cardiopulmonary disease. Electronically Signed   By: Inez Catalina M.D.   On: 02/10/2019 08:28   Dg C-arm 1-60 Min-no Report  Result Date: 02/14/2019 Fluoroscopy was utilized by the requesting physician.  No radiographic interpretation.    Dg Hip Operative Unilat W Or W/o Pelvis Right  Result Date: 02/14/2019 CLINICAL DATA:  RIGHT anterior approach  total hip replacement EXAM: OPERATIVE RIGHT HIP (WITH PELVIS IF PERFORMED) 2 VIEWS TECHNIQUE: Fluoroscopic spot image(s) were submitted for interpretation post-operatively. COMPARISON:  None FLUOROSCOPY TIME:  0 minutes 28 seconds Dose: 2.31 mGy FINDINGS: Acetabular and femoral components of a RIGHT hip prosthesis are identified. No fracture, dislocation or bone destruction identified in AP projection. IMPRESSION: RIGHT hip prosthesis without acute complication. Electronically Signed   By: Lavonia Dana M.D.   On: 02/14/2019 16:14    Disposition: Discharge disposition: 01-Home or Self Care       Discharge Instructions    Call MD / Call 911   Complete by: As directed    If you experience chest pain or shortness of breath, CALL 911 and be transported to the hospital emergency room.  If you develope a fever above 101 F, pus (white drainage) or increased drainage or redness at the wound, or calf pain, call your surgeon's office.   Constipation Prevention   Complete by: As directed    Drink plenty of fluids.  Prune juice may be helpful.  You may use a stool softener, such as Colace (over the counter) 100 mg twice a day.  Use MiraLax (over the counter) for constipation as needed.   Diet - low sodium heart healthy   Complete by: As directed    Discharge instructions   Complete by: As directed    INSTRUCTIONS AFTER JOINT REPLACEMENT   Remove items at home which could result in a fall. This includes throw rugs or furniture in walking pathways ICE to the affected joint every three hours while awake for 30 minutes at a time, for at least the first 3-5 days, and then as needed for pain and swelling.  Continue to use ice for pain and swelling. You may notice swelling that will progress down to the foot and ankle.  This is normal after surgery.  Elevate your leg when you are not up walking on  it.   Continue to use the breathing machine you got in the hospital (incentive spirometer) which will help keep your temperature down.  It is common for your temperature to cycle up and down following surgery, especially at night when you are not up moving around and exerting yourself.  The breathing machine keeps your lungs expanded and your temperature down.   DIET:  As you were doing prior to hospitalization, we recommend a well-balanced diet.  DRESSING / WOUND CARE / SHOWERING  You may shower 3 days after surgery, but keep the wounds dry during showering.  You may use an occlusive plastic wrap (Press'n Seal for example), NO SOAKING/SUBMERGING IN THE BATHTUB.  If the bandage gets wet, change with a clean dry gauze.  If the incision gets wet, pat the wound dry with a clean towel.  ACTIVITY  Increase activity slowly as tolerated, but follow the weight bearing instructions below.   No driving for 6 weeks or until further direction given by your physician.  You cannot drive while taking narcotics.  No lifting or carrying greater than 10 lbs. until further directed by your surgeon. Avoid periods of inactivity such as sitting longer than an hour when not asleep. This helps prevent blood clots.  You may return to work once you are authorized by your doctor.     WEIGHT BEARING   Weight bearing as tolerated with assist device (walker, cane, etc) as directed, use it as long as suggested by your surgeon or therapist, typically at least 4-6 weeks.   EXERCISES  Results after joint replacement surgery are often greatly improved when you follow the exercise, range of motion and muscle strengthening exercises prescribed by your doctor. Safety measures are also important to protect the joint from further injury. Any time any of these exercises cause you to have increased pain or swelling, decrease what you are doing until you are comfortable again and then slowly increase them. If you have problems or  questions, call your caregiver or physical therapist for advice.   Rehabilitation is important following a joint replacement. After just a few days of immobilization, the muscles of the leg can become weakened and shrink (atrophy).  These exercises are designed to build up the tone and strength of the thigh and leg muscles and to improve motion. Often times heat used for twenty to thirty minutes before working out will loosen up your tissues and help with improving the range of motion but do not use heat for the first two weeks following surgery (sometimes heat can increase post-operative swelling).   These exercises can be done on a training (exercise) mat, on the floor, on a table or on a bed. Use whatever works the best and is most comfortable for you.    Use music or television while you are exercising so that the exercises are a pleasant break in your day. This will make your life better with the exercises acting as a break in your routine that you can look forward to.   Perform all exercises about fifteen times, three times per day or as directed.  You should exercise both the operative leg and the other leg as well.   Exercises include:   Quad Sets - Tighten up the muscle on the front of the thigh (Quad) and hold for 5-10 seconds.   Straight Leg Raises - With your knee straight (if you were given a brace, keep it on), lift the leg to 60 degrees, hold for 3 seconds, and slowly lower the leg.  Perform this exercise against resistance later as your leg gets stronger.  Leg Slides: Lying on your back, slowly slide your foot toward your buttocks, bending your knee up off the floor (only go as far as is comfortable). Then slowly slide your foot back down until your leg is flat on the floor again.  Angel Wings: Lying on your back spread your legs to the side as far apart as you can without causing discomfort.  Hamstring Strength:  Lying on your back, push your heel against the floor with your leg straight  by tightening up the muscles of your buttocks.  Repeat, but this time bend your knee to a comfortable angle, and push your heel against the floor.  You may put a pillow under the heel to make it more comfortable if necessary.   A rehabilitation program following joint replacement surgery can speed recovery and prevent re-injury in the future due to weakened muscles. Contact your doctor or a physical therapist for more information on knee rehabilitation.    CONSTIPATION  Constipation is defined medically as fewer than three stools per week and severe constipation as less than one stool per week.  Even if you have a regular bowel pattern at home, your normal regimen is likely to be disrupted due to multiple reasons following surgery.  Combination of anesthesia, postoperative narcotics, change in appetite and fluid intake all can affect your bowels.   YOU MUST use at least one of the following options; they are listed in order of increasing  strength to get the job done.  They are all available over the counter, and you may need to use some, POSSIBLY even all of these options:    Drink plenty of fluids (prune juice may be helpful) and high fiber foods Colace 100 mg by mouth twice a day  Senokot for constipation as directed and as needed Dulcolax (bisacodyl), take with full glass of water  Miralax (polyethylene glycol) once or twice a day as needed.  If you have tried all these things and are unable to have a bowel movement in the first 3-4 days after surgery call either your surgeon or your primary doctor.    If you experience loose stools or diarrhea, hold the medications until you stool forms back up.  If your symptoms do not get better within 1 week or if they get worse, check with your doctor.  If you experience "the worst abdominal pain ever" or develop nausea or vomiting, please contact the office immediately for further recommendations for treatment.   ITCHING:  If you experience itching with  your medications, try taking only a single pain pill, or even half a pain pill at a time.  You can also use Benadryl over the counter for itching or also to help with sleep.   TED HOSE STOCKINGS:  Use stockings on both legs until for at least 2 weeks or as directed by physician office. They may be removed at night for sleeping.  MEDICATIONS:  See your medication summary on the "After Visit Summary" that nursing will review with you.  You may have some home medications which will be placed on hold until you complete the course of blood thinner medication.  It is important for you to complete the blood thinner medication as prescribed.  PRECAUTIONS:  If you experience chest pain or shortness of breath - call 911 immediately for transfer to the hospital emergency department.   If you develop a fever greater that 101 F, purulent drainage from wound, increased redness or drainage from wound, foul odor from the wound/dressing, or calf pain - CONTACT YOUR SURGEON.                                                   FOLLOW-UP APPOINTMENTS:  If you do not already have a post-op appointment, please call the office for an appointment to be seen by your surgeon.  Guidelines for how soon to be seen are listed in your "After Visit Summary", but are typically between 1-4 weeks after surgery.  OTHER INSTRUCTIONS:   Knee Replacement:  Do not place pillow under knee, focus on keeping the knee straight while resting. CPM instructions: 0-90 degrees, 2 hours in the morning, 2 hours in the afternoon, and 2 hours in the evening. Place foam block, curve side up under heel at all times except when in CPM or when walking.  DO NOT modify, tear, cut, or change the foam block in any way.  MAKE SURE YOU:  Understand these instructions.  Get help right away if you are not doing well or get worse.    Thank you for letting us be a part of your medical care team.  It is a privilege we respect greatly.  We hope these instructions  will help you stay on track for a fast and full recovery!   Increase activity slowly  as tolerated   Complete by: As directed       Follow-up Information    Melrose Nakayama, MD. Schedule an appointment as soon as possible for a visit in 2 weeks.   Specialty: Orthopedic Surgery Contact information: [place] [address] [phone]            Signed: Larwance Sachs Belladonna Lubinski 02/15/2019, 7:51 AM

## 2019-02-15 NOTE — TOC Transition Note (Signed)
Transition of Care [facility]) - CM/SW Discharge Note   Patient Details  Name: Emma Foster MRN: [id number] Date of Birth: 25-Dec-1960  Transition of Care [facility]) CM/SW Contact:  Lia Hopping, [place] Phone Number: 02/15/2019, 10:50 AM   Clinical Narrative:    Therapy Plan: Prearranged with [facility] in physician office.  DME-3 in1 and RW ordered through [place]   Final next level of care: [place] Barriers to Discharge: No Barriers Identified   Patient Goals and CMS Choice Patient states their goals for this hospitalization and ongoing recovery are:: Recover at Home      Discharge Placement  Home                     Discharge Plan and Services                DME Arranged: 3-N-1, Walker rolling DME Agency: [employer] Date DME Agency Contacted: 02/15/19 Time DME Agency Contacted: 0900 Representative spoke with at DME Agency: [place] ([place]) Interventions     Readmission Risk Interventions No flowsheet data found.

## 2019-02-15 NOTE — Plan of Care (Signed)
RN reviewed discharge instructions with patient and family. All questions answered.   Paperwork given and prescriptions sent.   NT and RN transported patient to family car.

## 2019-02-15 NOTE — Progress Notes (Signed)
Subjective: 1 Day Post-Op Procedure(s) (LRB): Right Anterior Hip Arthroplasty (Right)   Patient resting comfortably. She got up to the bathroom and moved well.   Activity level:  wbat Diet tolerance:  ok Voiding:  ok Patient reports pain as mild.    Objective: Vital signs in last 24 hours: Temp:  [97.4 F (36.3 C)-98.4 F (36.9 C)] 97.6 F (36.4 C) (08/05 0624) Pulse Rate:  [68-106] 84 (08/05 0624) Resp:  [10-19] 16 (08/05 0624) BP: (95-128)/(61-80) 100/66 (08/05 0624) SpO2:  [92 %-100 %] 94 % (08/05 0624) Weight:  [77 kg] 77 kg (08/04 1815)  Labs: No results for input(s): HGB in the last 72 hours. No results for input(s): WBC, RBC, HCT, PLT in the last 72 hours. No results for input(s): NA, K, CL, CO2, BUN, CREATININE, GLUCOSE, CALCIUM in the last 72 hours. No results for input(s): LABPT, INR in the last 72 hours.  Physical Exam:  Neurologically intact ABD soft Neurovascular intact Sensation intact distally Intact pulses distally Dorsiflexion/Plantar flexion intact Incision: dressing C/D/I and no drainage No cellulitis present Compartment soft  Assessment/Plan:  1 Day Post-Op Procedure(s) (LRB): Right Anterior Hip Arthroplasty (Right) Advance diet Up with therapy D/C IV fluids Discharge home with home health after PT today if doing well. Follow up in office 2 weeks post op. Continue on ASA 81mg  BID x 4 weeks post op for DVT prevention.    Larwance Sachs Scottie Stanish 02/15/2019, 7:47 AM

## 2019-02-15 NOTE — Anesthesia Postprocedure Evaluation (Signed)
Anesthesia Post Note  Patient: Emma Foster  Procedure(s) Performed: Right Anterior Hip Arthroplasty (Right Hip)     Patient location during evaluation: PACU Anesthesia Type: Spinal Level of consciousness: awake Pain management: pain level controlled Vital Signs Assessment: post-procedure vital signs reviewed and stable Respiratory status: spontaneous breathing Cardiovascular status: stable Postop Assessment: no apparent nausea or vomiting, no headache, no backache and spinal receding Anesthetic complications: no    Last Vitals:  Vitals:   02/15/19 0112 02/15/19 0624  BP: 99/64 100/66  Pulse: 90 84  Resp:  16  Temp:  36.4 C  SpO2: 99% 94%    Last Pain:  Vitals:   02/15/19 0624  TempSrc: Oral  PainSc: 5    Pain Goal: Patients Stated Pain Goal: 3 (02/15/19 0624)                 Huston Foley

## 2019-02-15 NOTE — Evaluation (Signed)
Physical Therapy Evaluation Patient Details Name: Emma Foster MRN: [id number] DOB: [birth year] Today's Date: 02/15/2019   History of Present Illness  58 yo female s/p R THA-DA 02/14/19  Clinical Impression  On eval, pt was Supv level for mobility. She walked ~125 feet with a RW. Moderate pain with some activity. Reviewed/practiced exercises, gait training, and stair training. All education completed. Okay to d/c from PT standpoint-RN aware.     Follow Up Recommendations Follow surgeon's recommendation for DC plan and follow-up therapies    Equipment Recommendations  Rolling walker with 5" wheels;3in1 (PT)    Recommendations for Other Services       Precautions / Restrictions Precautions Precautions: Fall Restrictions Weight Bearing Restrictions: No Other Position/Activity Restrictions: WBAT      Mobility  Bed Mobility Overal bed mobility: Needs Assistance Bed Mobility: Supine to Sit     Supine to sit: Supervision;HOB elevated     General bed mobility comments: for safety. Pt preferred to perform task herself.  Transfers Overall transfer level: Needs assistance Equipment used: Rolling walker (2 wheeled) Transfers: Sit to/from Stand Sit to Stand: Supervision         General transfer comment: Supv for safety, cues.  Ambulation/Gait Ambulation/Gait assistance: Supervision Gait Distance (Feet): 125 Feet Assistive device: Rolling walker (2 wheeled) Gait Pattern/deviations: Step-through pattern;Decreased stride length     General Gait Details: Supv for safety.  Stairs Stairs: Yes Stairs assistance: Supervision Stair Management: Forwards;One rail Right Number of Stairs: 5 General stair comments: up and over portable steps x 2. VCs safety, sequence, technique. Supv for safety  Wheelchair Mobility    Modified Rankin (Stroke Patients Only)       Balance                                             Pertinent Vitals/Pain Pain Assessment:  0-10 Pain Score: 6  Pain Location: R hip Pain Descriptors / Indicators: Discomfort;Aching;Sore Pain Intervention(s): Monitored during session;Ice applied    Home Living Family/patient expects to be discharged to:: Private residence Living Arrangements: Children Available Help at Discharge: Family Type of Home: House Home Access: Level entry     Home Layout: Bed/bath upstairs;Two level Home Equipment: Cane - single point      Prior Function Level of Independence: Independent               Hand Dominance        Extremity/Trunk Assessment   Upper Extremity Assessment Upper Extremity Assessment: Overall WFL for tasks assessed    Lower Extremity Assessment Lower Extremity Assessment: (post op weakness 2* THA)    Cervical / Trunk Assessment Cervical / Trunk Assessment: Normal  Communication   Communication: No difficulties  Cognition Arousal/Alertness: Awake/alert Behavior During Therapy: WFL for tasks assessed/performed Overall Cognitive Status: Within Functional Limits for tasks assessed                                        General Comments      Exercises Total Joint Exercises Ankle Circles/Pumps: AROM;Both;10 reps;Supine Quad Sets: AROM;Both;10 reps;Supine Heel Slides: AAROM;10 reps;Supine;Right Hip ABduction/ADduction: AAROM;10 reps;Supine;Right   Assessment/Plan    PT Assessment Patient needs continued PT services  PT Problem List Decreased strength;Decreased range of motion;Decreased mobility;Decreased balance;Decreased activity tolerance;Pain;Decreased knowledge of use of  DME       PT Treatment Interventions DME instruction;Gait training;Therapeutic activities;Therapeutic exercise;Balance training;Functional mobility training;Patient/family education    PT Goals (Current goals can be found in the Care Plan section)  Acute Rehab PT Goals Patient Stated Goal: regain PLOF/independence PT Goal Formulation: With patient Time For  Goal Achievement: 03/01/19 Potential to Achieve Goals: Good    Frequency Min 3X/week   Barriers to discharge        Co-evaluation               AM-PAC PT "6 Clicks" Mobility  Outcome Measure Help needed turning from your back to your side while in a flat bed without using bedrails?: A Little Help needed moving from lying on your back to sitting on the side of a flat bed without using bedrails?: A Little Help needed moving to and from a bed to a chair (including a wheelchair)?: A Little Help needed standing up from a chair using your arms (e.g., wheelchair or bedside chair)?: A Little Help needed to walk in hospital room?: A Little Help needed climbing 3-5 steps with a railing? : A Little 6 Click Score: 18    End of Session Equipment Utilized During Treatment: Gait belt Activity Tolerance: Patient tolerated treatment well Patient left: in bed;with call bell/phone within reach   PT Visit Diagnosis: Pain;Other abnormalities of gait and mobility (R26.89) Pain - Right/Left: Right Pain - part of body: Hip    Time: 2536-6440 PT Time Calculation (min) (ACUTE ONLY): 28 min   Charges:   PT Evaluation $PT Eval Low Complexity: 1 Low PT Treatments $Gait Training: 8-22 mins        Weston Anna, PT Acute Rehabilitation Services Pager: [phone] Office: [phone]

## 2019-04-12 IMAGING — MR MR LUMBAR SPINE W/O CM
4 of 5 series · 26 of 48 positions shown · non-contrast
Comparison: None.

CLINICAL DATA: Chronic low back pain radiating into the right hip,
progressively worsening.

EXAM:
MRI LUMBAR SPINE WITHOUT CONTRAST
TECHNIQUE: Multiplanar, multisequence MR imaging of the lumbar spine was
performed. No intravenous contrast was administered.

[Series 3: T2 post-contrast · sagittal · 4.0mm · 0.55mm/px · 6 of 13 slices shown]
[im 1/13]
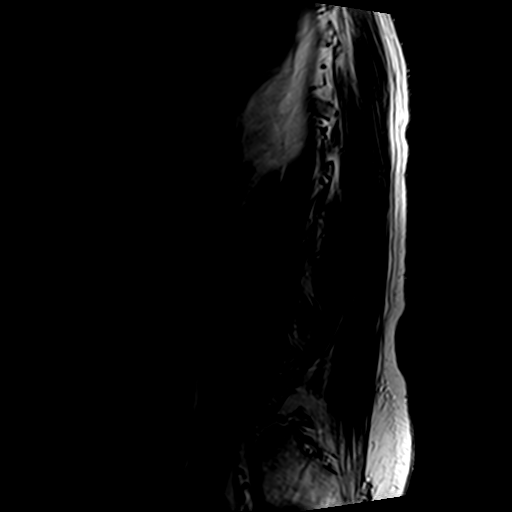
[im 3/13]
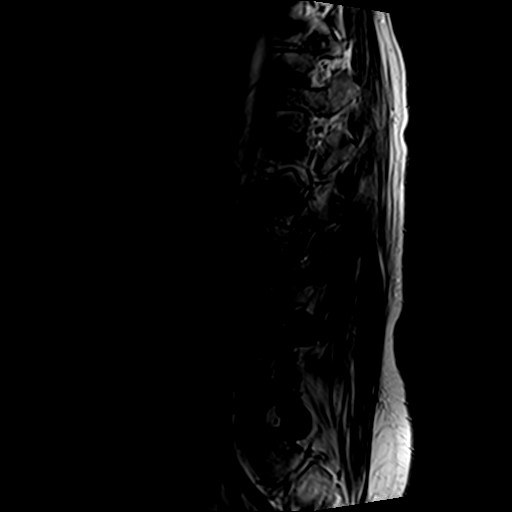
[im 5/13]
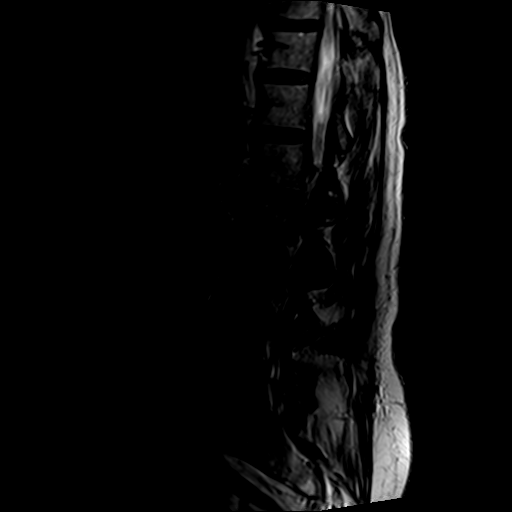
[im 8/13]
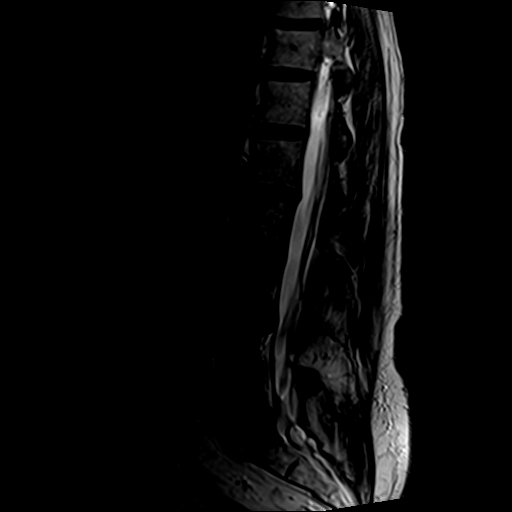
[im 10/13]
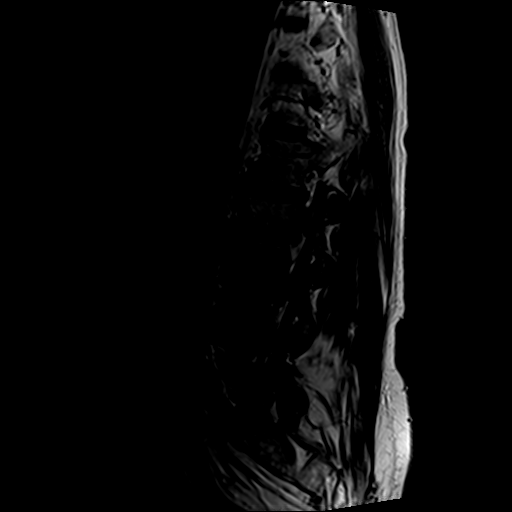
[im 13/13]
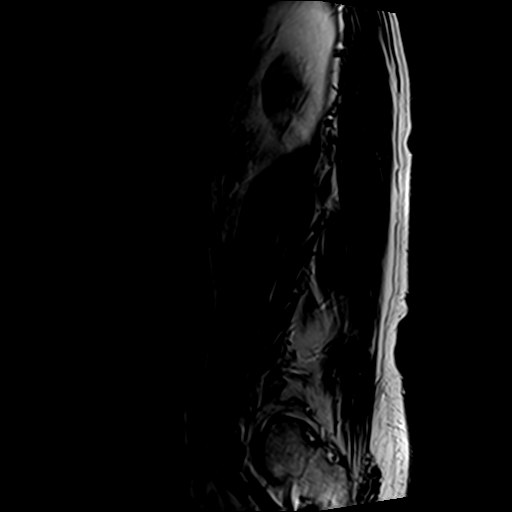

[Series 5: T1 · sagittal · 4.0mm · 0.55mm/px · 6 of 13 slices shown (1 of 2)]
[im 1/13]
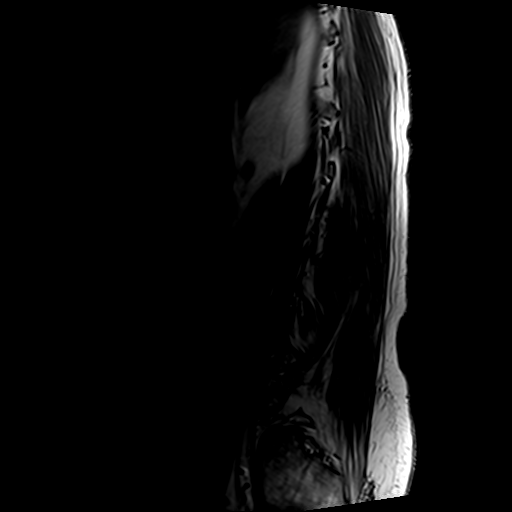
[im 3/13]
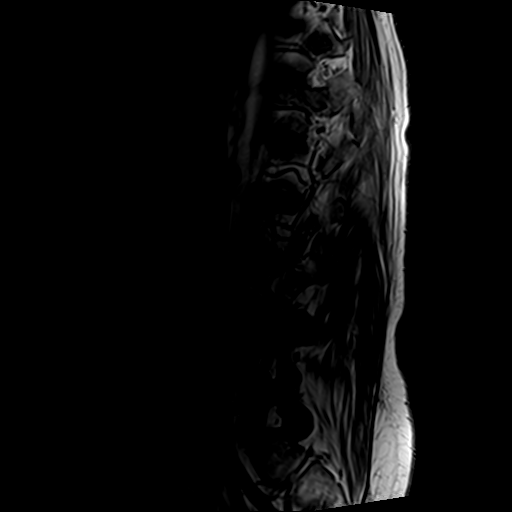
[im 5/13]
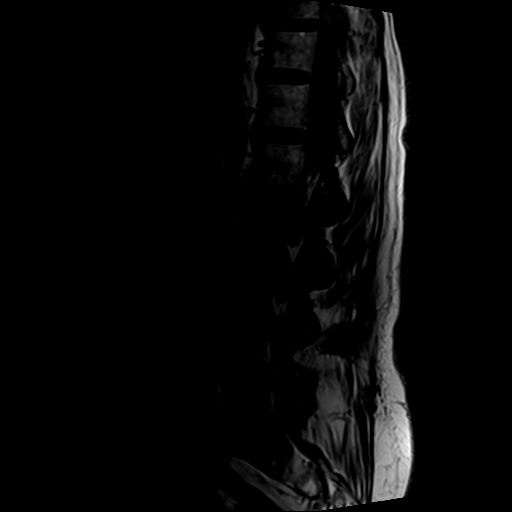
[im 8/13]
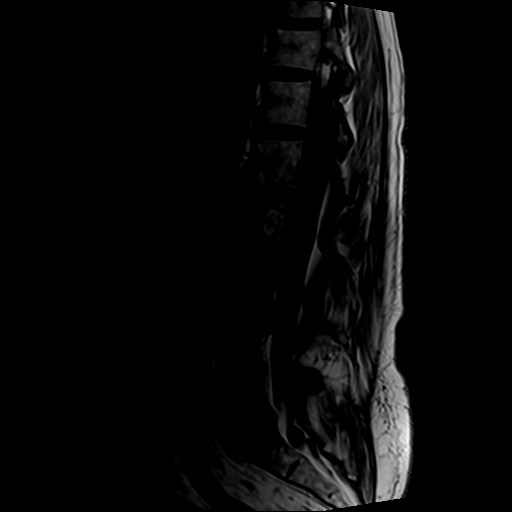
[im 10/13]
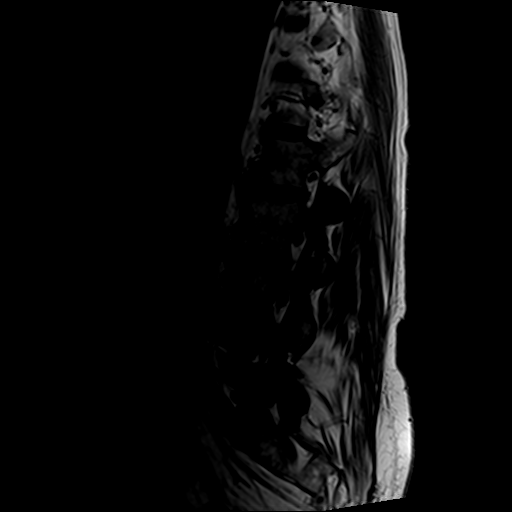
[im 13/13]
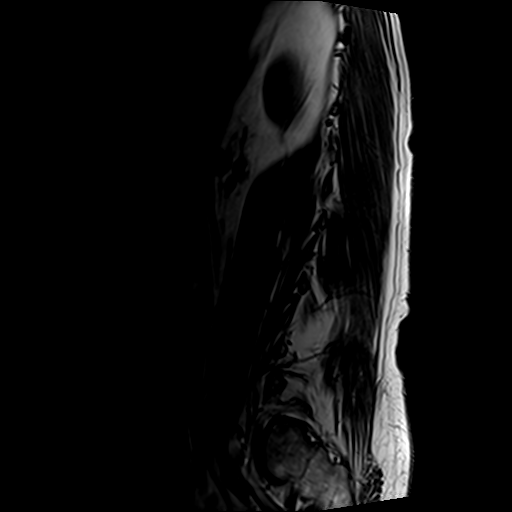

[Series 6: T2 · axial · 4.0mm · 0.70mm/px · z∈[-125,+76]mm · 9 of 35 slices shown]
[im 1/35]
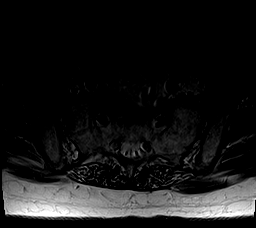
[im 5/35]
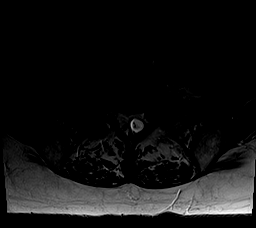
[im 10/35]
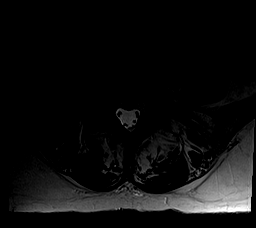
[im 15/35]
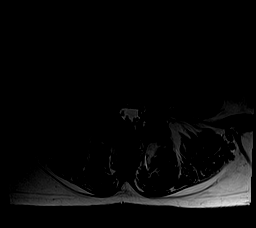
[im 18/35]
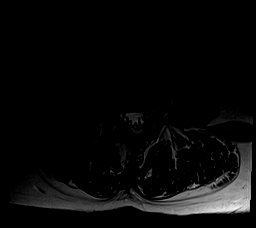
[im 20/35]
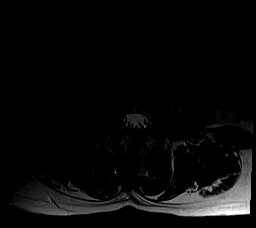
[im 25/35]
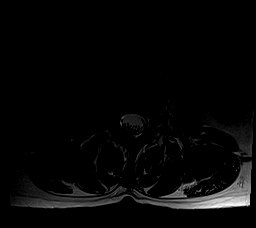
[im 30/35]
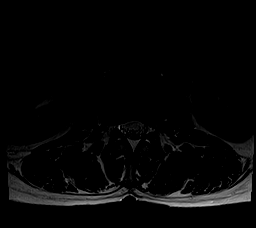
[im 35/35]
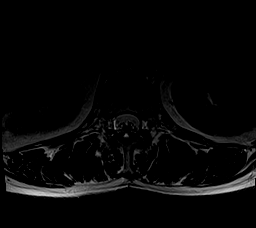

[Series 7: T1 · axial · 4.0mm · 0.35mm/px · z∈[-125,+39]mm · 5 of 35 slices shown (2 of 2)]
[im 1/35]
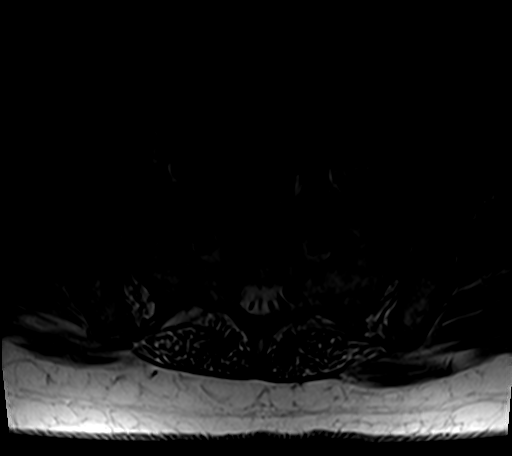
[im 5/35]
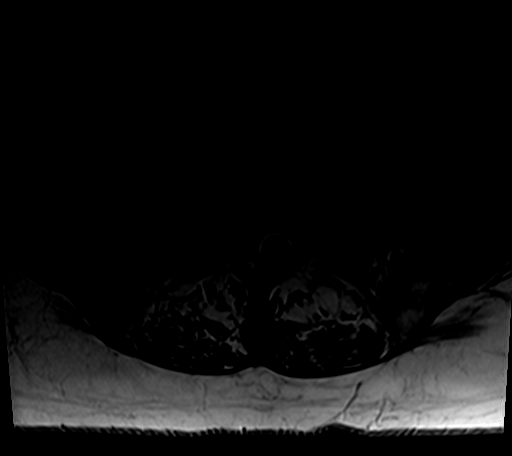
[im 10/35]
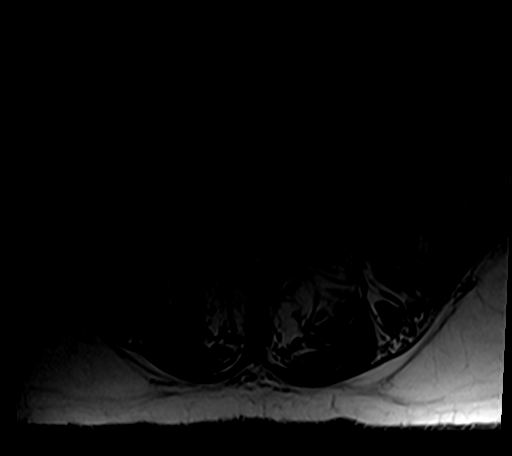
[im 18/35]
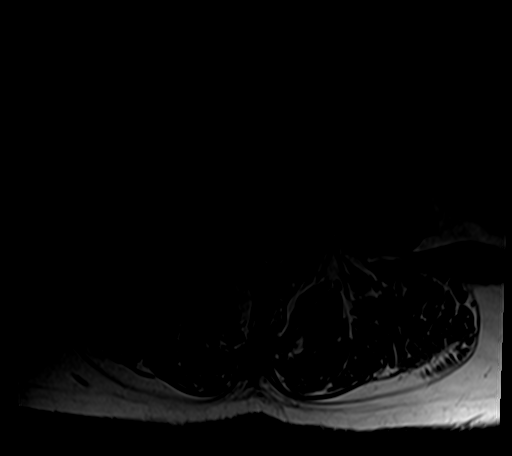
[im 30/35]
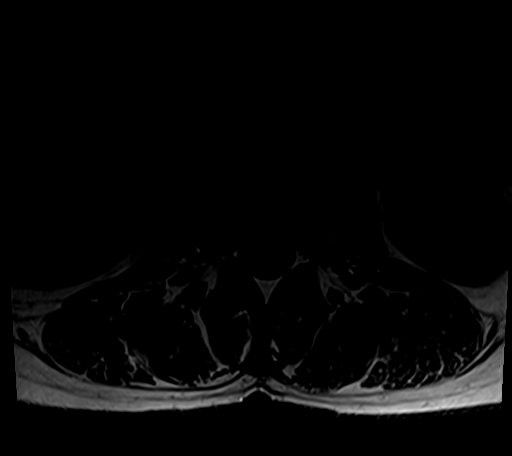

[26 of 48 positions shown; findings below may reference images not displayed]

FINDINGS: Segmentation: Congenital L4-L5 block vertebra with rudimentary disc
space.

Alignment:  Straightening of the normal lumbar lordosis.

Vertebrae:  No fracture, evidence of discitis, or bone lesion.

Conus medullaris and cauda equina: Conus extends to the T12-L1
level. Conus and cauda equina appear normal.

Paraspinal and other soft tissues: Negative.

Disc levels:

T12-L1:  Negative.

L1-L2:  Minimal disc bulging.  No stenosis.

L2-L3:  Negative.

L3-L4: Mild disc bulging eccentric to the right, slightly
encroaching on the extraforaminal right L3 nerve root. Mild
bilateral facet arthropathy. Mild bilateral lateral recess stenosis.
No spinal canal or neuroforaminal stenosis.

L4-L5: Rudimentary disc space with posterior element dysplasia. Mild
congenital left neuroforaminal stenosis. No spinal canal or right
neuroforaminal stenosis.

L5-S1: Small broad-based posterior disc protrusion. Mild bilateral
facet arthropathy. No stenosis.
IMPRESSION: 1. Right eccentric disc bulging at L3-L4 encroaches on and
potentially irritates the extraforaminal right L3 nerve root.
2. Congenital L4-L5 block vertebra with rudimentary disc space and
mild congenital left neuroforaminal stenosis.

## 2021-04-23 ENCOUNTER — Other Ambulatory Visit: Payer: Self-pay | Admitting: Orthopedic Surgery

## 2021-05-09 ENCOUNTER — Encounter (HOSPITAL_BASED_OUTPATIENT_CLINIC_OR_DEPARTMENT_OTHER): Payer: Self-pay | Admitting: Orthopedic Surgery

## 2021-05-09 ENCOUNTER — Other Ambulatory Visit: Payer: Self-pay

## 2021-05-12 ENCOUNTER — Encounter (HOSPITAL_BASED_OUTPATIENT_CLINIC_OR_DEPARTMENT_OTHER)
Admission: RE | Admit: 2021-05-12 | Discharge: 2021-05-12 | Disposition: A | Discharge status: Home / Self Care | Payer: 59 | Source: Ambulatory Visit | Attending: Orthopedic Surgery | Admitting: Orthopedic Surgery

## 2021-05-12 DIAGNOSIS — I10 Essential (primary) hypertension: Secondary | ICD-10-CM | POA: Insufficient documentation

## 2021-05-12 DIAGNOSIS — Z01818 Encounter for other preprocedural examination: Secondary | ICD-10-CM | POA: Diagnosis present

## 2021-05-12 DIAGNOSIS — E119 Type 2 diabetes mellitus without complications: Secondary | ICD-10-CM | POA: Diagnosis not present

## 2021-05-12 LAB — BASIC METABOLIC PANEL
Anion gap: 15 (ref 5–15)
BUN: 11 mg/dL (ref 8–23)
CO2: 22 mmol/L (ref 22–32)
Calcium: 9.7 mg/dL (ref 8.9–10.3)
Chloride: 103 mmol/L (ref 98–111)
Creatinine, Ser: 0.91 mg/dL (ref 0.44–1.00)
GFR, Estimated: >60 mL/min (ref >60)
Glucose, Bld: 140 mg/dL — ABNORMAL HIGH (ref 70–99)
Potassium: 4.1 mmol/L (ref 3.5–5.1)
Sodium: 140 mmol/L (ref 135–145)

## 2021-05-12 NOTE — Progress Notes (Signed)

## 2021-05-15 NOTE — H&P (Signed)
Primary Care Provider: Dr. Percell Miller Referring Provider: Self Worker's Comp: No Date of Injury or Onset: Chronic  History: CC / Reason for Visit: Right hand problem HPI: This patient returns reevaluation, indicating that she has exhausted her willingness to embrace nonoperative measures for her right TMC disease and would like to speak about details of surgical intervention.  HPI 01-21-21: This patient returns to clinic today for reevaluation of her right TMC indicating that she did receive about 4 months worth of relief from the last injection.  She states that the cool comfort splint that she had was ultimately too tight she has not even sure where it is.  She does use CBD oil regularly.    HPI 10/15/20:This patient returns reevaluation, now about 3 weeks following the TMC injection.  She is quite pleased with how well things have improved in her ability to rapidly cycle the long finger tip against the thumb fingertip.  She is using the cool comfort splint, but reports that it hurts.  Inside of it she has been placing a flat  Nik[employer] magnet from [place].  She also uses heat and some CBD oil.    HPI 09-24-20: This patient is a 60 year old female with a history of esophageal cancer and diabetes who presents for evaluation of right hand pain.  She reports long-standing history of arthritis at the base of the thumb.  She takes oxycodone for her back, and notes that she has an allergy to bleach.  At first she indicated she had an allergy to steroids, but then indicated that the problem was having 3 injections in a short period of time.  She has not used any bracing.  X-rays were obtained elsewhere on 09-16-20, revealing advanced TMC arthritic change on the right  Review of systems as related to current complaint reviewed and unchanged.  Exam:  Vitals: Refer to EMR. Constitutional:  WD, WN, NAD HEENT:  NCAT, EOMI Neuro/Psych:  Alert & oriented to person, place, and time; appropriate mood & affect Lymphatic:  No generalized UE edema or lymphadenopathy Extremities / MSK:  Both UE are normal with respect to appearance, ranges of motion, joint stability, muscle strength/tone, sensation, & perfusion except as otherwise noted:  The right thumb TMC joint remains enlarged.  There is fairly exquisite pain with TMC stress and grind testing, and even with palpation of the joint.  There is mild UCL instability with 20 MP hyperextensibility.  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  We discussed what thumb suspension arthroplasty entails, and the indications for capsulodesis at the MP joint.  We discussed that this is done as an outpatient, with regional block anesthesia, and what she might expect during the course of recovery, with 12 weeks of full-time splinting and not reaching peak improvement in satisfaction until somewhere in the 4-9 month window.  She would like to proceed in November.  We will work to do this on a time scale that suits her.  The details of the operative procedure were discussed with the patient.  Questions were invited and answered.  In addition to the goal of the procedure, the risks of the procedure to include but not limited to bleeding; infection; damage  to the nerves or blood vessels that could result in bleeding, numbness, weakness, chronic pain, and the need for additional procedures; stiffness; the need for revision surgery; and anesthetic risks were reviewed.  No specific outcome was guaranteed or implied.  Informed consent was obtained.  Electronically verified by:  Rayvon Char Grandville Silos, MD  CC: None

## 2021-05-16 NOTE — Anesthesia Preprocedure Evaluation (Addendum)
Anesthesia Evaluation  Patient identified by MRN, date of birth, ID band Patient awake    Reviewed: Allergy & Precautions, NPO status , Patient's Chart, lab work & pertinent test results  Airway Mallampati: II  TM Distance: >3 FB Neck ROM: Full    Dental no notable dental hx. (+) Poor Dentition, Dental Advisory Given, Missing,    Pulmonary sleep apnea (non-compliant w/ CPAP) , former smoker,    Pulmonary exam normal breath sounds clear to auscultation       Cardiovascular hypertension, Pt. on medications Normal cardiovascular exam Rhythm:Regular Rate:Normal     Neuro/Psych PSYCHIATRIC DISORDERS Anxiety Depression    GI/Hepatic Neg liver ROS, Hx esophageal ca 2014 s/p resection- pt unsure how much of esophagus they took, no difficulty swallowing Did have radiation to neck    Endo/Other  diabetes, Well Controlled, Type 2, Oral Hypoglycemic AgentsHypothyroidism a1c 6.0   Renal/GU negative Renal ROS  negative genitourinary   Musculoskeletal  (+) Arthritis , Osteoarthritis,  R thumb arthritis   Abdominal   Peds  Hematology negative hematology ROS (+)   Anesthesia Other Findings Chronic pain- on opiates  Reproductive/Obstetrics negative OB ROS                            Anesthesia Physical Anesthesia Plan  ASA: 3  Anesthesia Plan: MAC and Regional   Post-op Pain Management:  Regional for Post-op pain   Induction:   PONV Risk Score and Plan: 2 and Propofol infusion and TIVA  Airway Management Planned: Natural Airway and Simple Face Mask  Additional Equipment: None  Intra-op Plan:   Post-operative Plan:   Informed Consent: I have reviewed the patients History and Physical, chart, labs and discussed the procedure including the risks, benefits and alternatives for the proposed anesthesia with the patient or authorized representative who has indicated his/her understanding and  acceptance.       Plan Discussed with: CRNA  Anesthesia Plan Comments:        Anesthesia Quick Evaluation

## 2021-05-19 ENCOUNTER — Ambulatory Visit (HOSPITAL_COMMUNITY): Payer: 59

## 2021-05-19 ENCOUNTER — Ambulatory Visit (HOSPITAL_BASED_OUTPATIENT_CLINIC_OR_DEPARTMENT_OTHER): Payer: 59 | Admitting: Certified Registered"

## 2021-05-19 ENCOUNTER — Other Ambulatory Visit: Payer: Self-pay

## 2021-05-19 ENCOUNTER — Encounter (HOSPITAL_BASED_OUTPATIENT_CLINIC_OR_DEPARTMENT_OTHER)
Admission: RE | Disposition: A | Discharge status: Home / Self Care | Payer: Self-pay | Source: Home / Self Care | Attending: Orthopedic Surgery

## 2021-05-19 ENCOUNTER — Encounter (HOSPITAL_BASED_OUTPATIENT_CLINIC_OR_DEPARTMENT_OTHER): Payer: Self-pay | Admitting: Orthopedic Surgery

## 2021-05-19 ENCOUNTER — Ambulatory Visit (HOSPITAL_BASED_OUTPATIENT_CLINIC_OR_DEPARTMENT_OTHER)
Admission: RE | Admit: 2021-05-19 | Discharge: 2021-05-19 | Disposition: A | Discharge status: Home / Self Care | Payer: 59 | Attending: Orthopedic Surgery | Admitting: Orthopedic Surgery

## 2021-05-19 DIAGNOSIS — Z87891 Personal history of nicotine dependence: Secondary | ICD-10-CM | POA: Diagnosis not present

## 2021-05-19 DIAGNOSIS — Z91198 Patient's noncompliance with other medical treatment and regimen for other reason: Secondary | ICD-10-CM | POA: Diagnosis not present

## 2021-05-19 DIAGNOSIS — G4730 Sleep apnea, unspecified: Secondary | ICD-10-CM | POA: Insufficient documentation

## 2021-05-19 DIAGNOSIS — Z419 Encounter for procedure for purposes other than remedying health state, unspecified: Secondary | ICD-10-CM

## 2021-05-19 DIAGNOSIS — I10 Essential (primary) hypertension: Secondary | ICD-10-CM | POA: Insufficient documentation

## 2021-05-19 DIAGNOSIS — M1811 Unilateral primary osteoarthritis of first carpometacarpal joint, right hand: Secondary | ICD-10-CM | POA: Insufficient documentation

## 2021-05-19 DIAGNOSIS — Z79891 Long term (current) use of opiate analgesic: Secondary | ICD-10-CM | POA: Insufficient documentation

## 2021-05-19 DIAGNOSIS — Z9049 Acquired absence of other specified parts of digestive tract: Secondary | ICD-10-CM | POA: Diagnosis not present

## 2021-05-19 DIAGNOSIS — Z923 Personal history of irradiation: Secondary | ICD-10-CM | POA: Insufficient documentation

## 2021-05-19 DIAGNOSIS — Z7984 Long term (current) use of oral hypoglycemic drugs: Secondary | ICD-10-CM | POA: Diagnosis not present

## 2021-05-19 DIAGNOSIS — E119 Type 2 diabetes mellitus without complications: Secondary | ICD-10-CM | POA: Insufficient documentation

## 2021-05-19 DIAGNOSIS — Z8501 Personal history of malignant neoplasm of esophagus: Secondary | ICD-10-CM | POA: Insufficient documentation

## 2021-05-19 DIAGNOSIS — G8929 Other chronic pain: Secondary | ICD-10-CM | POA: Insufficient documentation

## 2021-05-19 DIAGNOSIS — Z79899 Other long term (current) drug therapy: Secondary | ICD-10-CM | POA: Diagnosis not present

## 2021-05-19 HISTORY — DX: Other chronic pain: G89.29

## 2021-05-19 HISTORY — PX: CARPOMETACARPEL SUSPENSION PLASTY: SHX5005

## 2021-05-19 LAB — GLUCOSE, CAPILLARY
Glucose-Capillary: 108 mg/dL — ABNORMAL HIGH (ref 70–99)
Glucose-Capillary: 129 mg/dL — ABNORMAL HIGH (ref 70–99)

## 2021-05-19 SURGERY — CARPOMETACARPEL (CMC) SUSPENSION PLASTY
Anesthesia: Monitor Anesthesia Care | Site: Thumb | Laterality: Right

## 2021-05-19 MED ORDER — ACETAMINOPHEN 325 MG PO TABS
650.0000 mg | ORAL_TABLET | Freq: Once | ORAL | Status: AC
  Administered 2021-05-19: 650 mg via ORAL

## 2021-05-19 MED ORDER — DEXAMETHASONE SODIUM PHOSPHATE 10 MG/ML IJ SOLN
INTRAMUSCULAR | Status: DC | PRN
  Administered 2021-05-19: 10 mg

## 2021-05-19 MED ORDER — ROPIVACAINE HCL 5 MG/ML IJ SOLN: INTRAMUSCULAR | Status: DC | PRN

## 2021-05-19 MED ORDER — 0.9 % SODIUM CHLORIDE (POUR BTL) OPTIME
TOPICAL | Status: DC | PRN
  Administered 2021-05-19: 120 mL

## 2021-05-19 MED ORDER — ONDANSETRON HCL 4 MG/2ML IJ SOLN: 4.0000 mg | Freq: Once | INTRAMUSCULAR | Status: DC | PRN

## 2021-05-19 MED ORDER — MIDAZOLAM HCL 2 MG/2ML IJ SOLN
1.0000 mg | Freq: Once | INTRAMUSCULAR | Status: AC
  Administered 2021-05-19: 1 mg via INTRAVENOUS

## 2021-05-19 MED ORDER — ROPIVACAINE HCL 5 MG/ML IJ SOLN
INTRAMUSCULAR | Status: DC | PRN
  Administered 2021-05-19: 30 mL via PERINEURAL

## 2021-05-19 MED ORDER — OXYCODONE HCL 5 MG PO TABS: 5.0000 mg | ORAL_TABLET | Freq: Once | ORAL | Status: DC | PRN

## 2021-05-19 MED ORDER — ACETAMINOPHEN 325 MG PO TABS
ORAL_TABLET | ORAL | Status: AC
  Filled 2021-05-19: qty 2

## 2021-05-19 MED ORDER — PROPOFOL 10 MG/ML IV BOLUS
INTRAVENOUS | Status: AC
  Filled 2021-05-19: qty 20

## 2021-05-19 MED ORDER — KETOROLAC TROMETHAMINE 30 MG/ML IJ SOLN: 30.0000 mg | Freq: Once | INTRAMUSCULAR | Status: DC | PRN

## 2021-05-19 MED ORDER — CEFAZOLIN SODIUM-DEXTROSE 2-4 GM/100ML-% IV SOLN
2.0000 g | INTRAVENOUS | Status: AC
  Administered 2021-05-19: 2 g via INTRAVENOUS

## 2021-05-19 MED ORDER — FENTANYL CITRATE (PF) 100 MCG/2ML IJ SOLN
INTRAMUSCULAR | Status: AC
  Filled 2021-05-19: qty 2

## 2021-05-19 MED ORDER — ONDANSETRON HCL 4 MG/2ML IJ SOLN
INTRAMUSCULAR | Status: DC | PRN
  Administered 2021-05-19: 4 mg via INTRAVENOUS

## 2021-05-19 MED ORDER — OXYCODONE HCL 5 MG/5ML PO SOLN: 5.0000 mg | Freq: Once | ORAL | Status: DC | PRN

## 2021-05-19 MED ORDER — FENTANYL CITRATE (PF) 100 MCG/2ML IJ SOLN
50.0000 ug | Freq: Once | INTRAMUSCULAR | Status: AC
  Administered 2021-05-19: 50 ug via INTRAVENOUS

## 2021-05-19 MED ORDER — ONDANSETRON HCL 4 MG/2ML IJ SOLN
INTRAMUSCULAR | Status: AC
  Filled 2021-05-19: qty 2

## 2021-05-19 MED ORDER — LACTATED RINGERS IV SOLN: INTRAVENOUS | Status: DC

## 2021-05-19 MED ORDER — PROPOFOL 500 MG/50ML IV EMUL
INTRAVENOUS | Status: DC | PRN
Start: 2021-05-19 — End: 2021-05-19
  Administered 2021-05-19: 100 ug/kg/min via INTRAVENOUS

## 2021-05-19 MED ORDER — MIDAZOLAM HCL 2 MG/2ML IJ SOLN
INTRAMUSCULAR | Status: AC
  Filled 2021-05-19: qty 2

## 2021-05-19 MED ORDER — HYDROMORPHONE HCL 1 MG/ML IJ SOLN: 0.2500 mg | INTRAMUSCULAR | Status: DC | PRN

## 2021-05-19 MED ORDER — CEFAZOLIN SODIUM-DEXTROSE 2-4 GM/100ML-% IV SOLN
INTRAVENOUS | Status: AC
  Filled 2021-05-19: qty 100

## 2021-05-19 SURGICAL SUPPLY — 56 items
APL PRP STRL LF DISP 70% ISPRP (MISCELLANEOUS) ×1
BIT DRILL 11/64XX180123XX4 (BIT) ×1
BIT DRILL 11/64XX180123XX4.3 (BIT) ×1 IMPLANT
BLADE AVERAGE 25X9 (BLADE) ×2 IMPLANT
BLADE MINI RND TIP GREEN BEAV (BLADE) IMPLANT
BLADE SURG 15 STRL LF DISP TIS (BLADE) ×2 IMPLANT
BLADE SURG 15 STRL SS (BLADE) ×4
BNDG CMPR 9X4 STRL LF SNTH (GAUZE/BANDAGES/DRESSINGS) ×1
BNDG COHESIVE 4X5 TAN ST LF (GAUZE/BANDAGES/DRESSINGS) ×2 IMPLANT
BNDG ESMARK 4X9 LF (GAUZE/BANDAGES/DRESSINGS) ×2 IMPLANT
BNDG GAUZE ELAST 4 BULKY (GAUZE/BANDAGES/DRESSINGS) ×2 IMPLANT
CHLORAPREP W/TINT 26 (MISCELLANEOUS) ×2 IMPLANT
CORD BIPOLAR FORCEPS 12FT (ELECTRODE) ×2 IMPLANT
COVER BACK TABLE 60X90IN (DRAPES) ×2 IMPLANT
COVER MAYO STAND STRL (DRAPES) ×2 IMPLANT
CUFF TOURN SGL QUICK 18X4 (TOURNIQUET CUFF) IMPLANT
DECANTER SPIKE VIAL GLASS SM (MISCELLANEOUS) IMPLANT
DRAPE EXTREMITY T 121X128X90 (DISPOSABLE) ×2 IMPLANT
DRAPE SURG 17X23 STRL (DRAPES) IMPLANT
DRAPE U-SHAPE 47X51 STRL (DRAPES) ×2 IMPLANT
DRILL BIT 1/8DIAX5INL DISPOSE (BIT) ×2 IMPLANT
DRILL BIT 11/64XX180123XX4.3 (BIT) ×2
DRSG EMULSION OIL 3X3 NADH (GAUZE/BANDAGES/DRESSINGS) ×2 IMPLANT
GAUZE 4X4 16PLY ~~LOC~~+RFID DBL (SPONGE) ×2 IMPLANT
GAUZE SPONGE 4X4 12PLY STRL LF (GAUZE/BANDAGES/DRESSINGS) ×2 IMPLANT
GLOVE SRG 8 PF TXTR STRL LF DI (GLOVE) ×2 IMPLANT
GLOVE SURG POLYISO LF SZ6.5 (GLOVE) ×4 IMPLANT
GLOVE SURG UNDER POLY LF SZ7 (GLOVE) ×8 IMPLANT
GLOVE SURG UNDER POLY LF SZ8 (GLOVE) ×4
GOWN STRL REUS W/ TWL LRG LVL3 (GOWN DISPOSABLE) ×2 IMPLANT
GOWN STRL REUS W/TWL 2XL LVL3 (GOWN DISPOSABLE) ×2 IMPLANT
GOWN STRL REUS W/TWL LRG LVL3 (GOWN DISPOSABLE) ×4
GOWN STRL REUS W/TWL XL LVL3 (GOWN DISPOSABLE) ×2 IMPLANT
K-WIRE .062X4 (WIRE) ×2 IMPLANT
LOOP VESSEL MAXI BLUE (MISCELLANEOUS) IMPLANT
LOOP VESSEL MINI RED (MISCELLANEOUS) IMPLANT
NEEDLE HYPO 25X1 1.5 SAFETY (NEEDLE) IMPLANT
NS IRRIG 1000ML POUR BTL (IV SOLUTION) ×2 IMPLANT
PACK BASIN DAY SURGERY FS (CUSTOM PROCEDURE TRAY) ×2 IMPLANT
PADDING CAST ABS 4INX4YD NS (CAST SUPPLIES) ×1
PADDING CAST ABS COTTON 4X4 ST (CAST SUPPLIES) ×1 IMPLANT
SLEEVE SCD COMPRESS KNEE MED (STOCKING) ×2 IMPLANT
SLING ARM FOAM STRAP MED (SOFTGOODS) ×2 IMPLANT
SPLINT PLASTER CAST XFAST 3X15 (CAST SUPPLIES) ×8 IMPLANT
SPLINT PLASTER XTRA FASTSET 3X (CAST SUPPLIES) ×8
STOCKINETTE 6  STRL (DRAPES) ×1
STOCKINETTE 6 STRL (DRAPES) ×1 IMPLANT
SUT ETHIBOND 2-0 V-5 NEEDLE (SUTURE) IMPLANT
SUT ETHIBOND 3-0 V-5 (SUTURE) ×2 IMPLANT
SUT STEEL 4 (SUTURE) ×2 IMPLANT
SUT VICRYL RAPIDE 4-0 (SUTURE) IMPLANT
SUT VICRYL RAPIDE 4/0 PS 2 (SUTURE) ×2 IMPLANT
SYR 10ML LL (SYRINGE) IMPLANT
SYR BULB EAR ULCER 3OZ GRN STR (SYRINGE) ×2 IMPLANT
TOWEL GREEN STERILE FF (TOWEL DISPOSABLE) ×2 IMPLANT
UNDERPAD 30X36 HEAVY ABSORB (UNDERPADS AND DIAPERS) ×2 IMPLANT

## 2021-05-19 NOTE — Discharge Instructions (Addendum)
Discharge Instructions   You have a dressing with a plaster splint incorporated in it. Move your fingers as much as possible, making a full fist and fully opening the fist. Elevate your hand to reduce pain & swelling of the digits.  Ice over the operative site may be helpful to reduce pain & swelling.  DO NOT USE HEAT. Pain management will be handled by your current pain management physician as discussed today in pre op.  Leave the dressing in place until you return to our office.  You may shower, but keep the bandage clean & dry.  You may drive a car when you are off of prescription pain medications and can safely control your vehicle with both hands. Our office will call you to arrange your follow up appointments.   Please call [phone] during normal business hours or [phone] after hours for any problems. Including the following:  - excessive redness of the incisions - drainage for more than 4 days - fever of more than 101.5 F  *Please note that pain medications will not be refilled after hours or on weekends.    Post Anesthesia Home Care Instructions  Activity: Get plenty of rest for the remainder of the day. A responsible individual must stay with you for 24 hours following the procedure.  For the next 24 hours, DO NOT: -Drive a car -Paediatric nurse -Drink alcoholic beverages -Take any medication unless instructed by your physician -Make any legal decisions or sign important papers.  Meals: Start with liquid foods such as gelatin or soup. Progress to regular foods as tolerated. Avoid greasy, spicy, heavy foods. If nausea and/or vomiting occur, drink only clear liquids until the nausea and/or vomiting subsides. Call your physician if vomiting continues.  Special Instructions/Symptoms: Your throat may feel dry or sore from the anesthesia or the breathing tube placed in your throat during surgery. If this causes discomfort, gargle with warm salt water. The discomfort  should disappear within 24 hours.  If you had a scopolamine patch placed behind your ear for the management of post- operative nausea and/or vomiting:  1. The medication in the patch is effective for 72 hours, after which it should be removed.  Wrap patch in a tissue and discard in the trash. Wash hands thoroughly with soap and water. 2. You may remove the patch earlier than 72 hours if you experience unpleasant side effects which may include dry mouth, dizziness or visual disturbances. 3. Avoid touching the patch. Wash your hands with soap and water after contact with the patch.    Regional Anesthesia Blocks  1. Numbness or the inability to move the "blocked" extremity may last from 3-48 hours after placement. The length of time depends on the medication injected and your individual response to the medication. If the numbness is not going away after 48 hours, call your surgeon.  2. The extremity that is blocked will need to be protected until the numbness is gone and the  Strength has returned. Because you cannot feel it, you will need to take extra care to avoid injury. Because it may be weak, you may have difficulty moving it or using it. You may not know what position it is in without looking at it while the block is in effect.  3. For blocks in the legs and feet, returning to weight bearing and walking needs to be done carefully. You will need to wait until the numbness is entirely gone and the strength has returned. You should be able  to move your leg and foot normally before you try and bear weight or walk. You will need someone to be with you when you first try to ensure you do not fall and possibly risk injury.  4. Bruising and tenderness at the needle site are common side effects and will resolve in a few days.  5. Persistent numbness or new problems with movement should be communicated to the surgeon or the [facility] [place] [phone] [place] [phone]).    No tylenol until after 1:00pm today, if needed.

## 2021-05-19 NOTE — Anesthesia Procedure Notes (Signed)
Anesthesia Regional Block: Supraclavicular block   Pre-Anesthetic Checklist: , timeout performed,  Correct Patient, Correct Site, Correct Laterality,  Correct Procedure, Correct Position, site marked,  Risks and benefits discussed,  Surgical consent,  Pre-op evaluation,  At surgeon's request and post-op pain management  Laterality: Right  Prep: Maximum Sterile Barrier Precautions used, chloraprep       Needles:  Injection technique: Single-shot  Needle Type: Echogenic Stimulator Needle     Needle Length: 9cm  Needle Gauge: 22     Additional Needles:   Procedures:,,,, ultrasound used (permanent image in chart),,    Narrative:  Start time: 05/19/2021 8:05 AM End time: 05/19/2021 8:10 AM Injection made incrementally with aspirations every 5 mL.  Performed by: Personally  Anesthesiologist: Pervis Hocking, DO  Additional Notes: Monitors applied. No increased pain on injection. No increased resistance to injection. Injection made in 5cc increments. Good needle visualization. Patient tolerated procedure well.

## 2021-05-19 NOTE — Anesthesia Postprocedure Evaluation (Signed)
Anesthesia Post Note  Patient: Emma Foster  Procedure(s) Performed: RIGHT THUMB SUSPENSION ARTHROPLASTY AND METACARPOPHALANGEAL JOINT CAPSULODESIS (Right: Thumb)     Patient location during evaluation: PACU Anesthesia Type: Regional and MAC Level of consciousness: awake and alert Pain management: pain level controlled Vital Signs Assessment: post-procedure vital signs reviewed and stable Respiratory status: spontaneous breathing, nonlabored ventilation and respiratory function stable Cardiovascular status: blood pressure returned to baseline and stable Postop Assessment: no apparent nausea or vomiting Anesthetic complications: no   No notable events documented.  Last Vitals:  Vitals:   05/19/21 1115 05/19/21 1148  BP: (!) 145/75 (!) 132/95  Pulse: 79 88  Resp: 18 16  Temp:  37.1 C  SpO2: 94% 95%    Last Pain:  Vitals:   05/19/21 1148  TempSrc: Oral  PainSc: 0-No pain                 Pervis Hocking

## 2021-05-19 NOTE — Interval H&P Note (Signed)
History and Physical Interval Note:  05/19/2021 7:44 AM  Emma Foster  has presented today for surgery, with the diagnosis of RIGHT THUMB ARTHRITIS.  The various methods of treatment have been discussed with the patient and family. After consideration of risks, benefits and other options for treatment, the patient has consented to  Procedure(s) with comments: RIGHT THUMB SUSPENSION ARTHROPLASTY AND METACARPOPHALANGEAL JOINT CAPSULODESIS (Right) - PRE-OP BLOCK as a surgical intervention.  The patient's history has been reviewed, patient examined, no change in status, stable for surgery.  I have reviewed the patient's chart and labs.  Questions were answered to the patient's satisfaction.     Jolyn Nap

## 2021-05-19 NOTE — Op Note (Signed)
05/19/2021  7:44 AM  PATIENT:  Emma Foster  60 y.o. 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 Grandville Silos, MD  PHYSICIAN ASSISTANT: Morley Kos, OPA-C  ANESTHESIA:  regional and MAC  SPECIMENS:  None  DRAINS:   None  EBL:  less than 50 mL  PREOPERATIVE INDICATIONS:  Emma Foster is a  60 y.o. 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  She has verbalized desire to proceed operatively.  The risks benefits and alternatives were discussed with the patient preoperatively including but not limited to the risks of infection, bleeding, nerve injury, cardiopulmonary complications, the need for revision surgery, among others, and the patient verbalized understanding and consented to proceed.  OPERATIVE IMPLANTS: None  OPERATIVE PROCEDURE:  After receiving prophylactic antibiotics and a regional block, the patient was escorted to the operative theatre and placed in a supine position.  A surgical "time-out" was performed during which the planned procedure, proposed operative site, and the correct patient identity were compared to the operative consent and agreement confirmed by the circulating nurse according to current facility policy.  Following application of a tourniquet to the operative extremity, the exposed skin was prepped with Chloraprep and draped in the usual sterile fashion.  The limb was exsanguinated with an Esmarch bandage and the tourniquet inflated to approximately 156mHg higher than systolic BP.  The volar MP joint was approached first to a 2 limb zigzag incision.  Skin flap was elevated and the A1 pulley incised.  The flexor tendon was reflected in the volar capsule and flexor sheath was  incised transversely.  An ellipse of capsule was then excised, including the sesamoid and 3-0 Ethibond suture placed across the capsulotomy but not yet tightened attention.  Attention was shifted to the [facility].  The [facility] was approached with a Wagner incision.  Full-thickness flaps were elevated protecting the cutaneous nerves.  A longitudinal incision was carried down on the radial aspect of the APL entering the [facility] and inspecting the surfaces, which were found to be with advanced arthritis.  The capsule was reflected off of the trapezium and it was excised piecemeal.  The scaphoid trapezoid joint was inspected and found to also be arthritic, so a proximal hemitrapezoid ectomy was performed with an oscillating saw and osteotome and rondure.  The longitudinal half strip of FCR was harvested through a separate oblique proximal incision and it was developed all the way back to its origin of the second metacarpal.  A tunnel was placed in the base of the first metacarpal but successively drilling with a 0.062 inch K wire, smaller and then a larger drill bit ending at 3.5 mm.  Ethibond suture was placed in the base of the resection space for securing the interposition material.  The wound was irrigated, the longitudinal half strip of FCR brought back down to the dorsal capsule and tied and a half hitch around it intact portion.  Half it was secured with 3-0 Ethibond.  The interposition material was then secured with the 3-0 Ethibond noted and placed in the depths of the resection space.  This was tensioned with the thumb compressed nor distracted.  Satisfied with the provisional alignment and insetting of the thumb suspension arthroplasty, a 0.062 inch K wire was driven across the MP joint obliquely with that in  20 to 30 degrees of flexion and the MP volar capsulodesis was then secured by tying the previously placed sutures.  Tourniquet was released additional hemostasis obtained with bipolar cautery and  the wounds were irrigated.  The capsule about the interposition material at the site of the [facility] resection was then repaired with 3-0 Ethibond suture.  The thenar muscles were replaced to their native alignment and secured with suture as well.  The skin at all sites was closed with 4-0 Vicryl Rapide interrupted and running horizontal mattress sutures.  The pin was clipped and bent over at the skin and a short arm thumb spica splint dressing was applied with a plaster component.  She was taken to recovery in stable condition.  DISPOSITION: She will be discharged home today with double instructions, returning in 10 to 15 days for reevaluation, pulling the pin, and proceeding to therapy to have custom splint fabricated which includes the thumb MP joint in a 20 degree flexed position and initiation of rehab.

## 2021-05-19 NOTE — Transfer of Care (Signed)
Immediate Anesthesia Transfer of Care Note  Patient: Emma Foster  Procedure(s) Performed: RIGHT THUMB SUSPENSION ARTHROPLASTY AND METACARPOPHALANGEAL JOINT CAPSULODESIS (Right: Thumb)  Patient Location: PACU  Anesthesia Type:MAC  Level of Consciousness: awake, alert  and oriented  Airway & Oxygen Therapy: Patient Spontanous Breathing and Patient connected to nasal cannula oxygen  Post-op Assessment: Report given to RN and Post -op Vital signs reviewed and stable  Post vital signs: Reviewed and stable  Last Vitals:  Vitals Value Taken Time  BP 150/83 05/19/21 1046  Temp    Pulse 81 05/19/21 1050  Resp 17 05/19/21 1050  SpO2 97 % 05/19/21 1050  Vitals shown include unvalidated device data.  Last Pain:  Vitals:   05/19/21 0700  TempSrc: Oral  PainSc: 8       Patients Stated Pain Goal: 3 (33/35/45 6256)  Complications: No notable events documented.

## 2021-05-19 NOTE — Progress Notes (Signed)
Assisted Dr. Doroteo Glassman with right, ultrasound guided, supraclavicular block. Side rails up, monitors on throughout procedure. See vital signs in flow sheet. Tolerated Procedure well.

## 2022-09-05 ENCOUNTER — Encounter (HOSPITAL_BASED_OUTPATIENT_CLINIC_OR_DEPARTMENT_OTHER): Payer: Self-pay | Admitting: Emergency Medicine

## 2022-09-05 ENCOUNTER — Emergency Department (HOSPITAL_BASED_OUTPATIENT_CLINIC_OR_DEPARTMENT_OTHER)
Admission: EM | Admit: 2022-09-05 | Discharge: 2022-09-05 | Discharge status: Against Medical Advice | Payer: Commercial Managed Care - HMO | Attending: Emergency Medicine | Admitting: Emergency Medicine

## 2022-09-05 ENCOUNTER — Other Ambulatory Visit: Payer: Self-pay

## 2022-09-05 DIAGNOSIS — Z5321 Procedure and treatment not carried out due to patient leaving prior to being seen by health care provider: Secondary | ICD-10-CM | POA: Insufficient documentation

## 2022-09-05 DIAGNOSIS — M25552 Pain in left hip: Secondary | ICD-10-CM | POA: Diagnosis not present

## 2022-09-05 NOTE — ED Triage Notes (Addendum)
Pt here with chronic LT hip pain; sts she is out of pain medication 1 wk and will see new pain management doctor on 09/16/21

## 2022-09-05 NOTE — ED Notes (Signed)
Pt reports she is leaving at this time.

## 2022-09-05 NOTE — ED Notes (Signed)
Pt requesting pain medication. MD made aware. 

## 2022-09-06 NOTE — ED Provider Notes (Signed)
Presented with left hip pain. Placed in a room but left without being seen.    Gareth Morgan, MD 09/06/22 [phone]
